# Patient Record
Sex: Female | Born: 1985 | Race: Black or African American | Hispanic: No | Marital: Single | Smoking: Never smoker
Health system: Southern US, Community
[De-identification: ages and names within clinical notes are randomized; demographics above are authoritative.]

## PROBLEM LIST (undated history)

## (undated) DIAGNOSIS — G709 Myoneural disorder, unspecified: Secondary | ICD-10-CM

## (undated) DIAGNOSIS — R519 Headache, unspecified: Secondary | ICD-10-CM

## (undated) DIAGNOSIS — Z331 Pregnant state, incidental: Secondary | ICD-10-CM

## (undated) HISTORY — PX: TUBAL LIGATION: SHX77

---
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
## 2003-01-20 ENCOUNTER — Emergency Department (HOSPITAL_COMMUNITY): Admission: EM | Admit: 2003-01-20 | Discharge: 2003-01-20 | Payer: Self-pay | Admitting: Emergency Medicine

## 2003-03-06 ENCOUNTER — Emergency Department (HOSPITAL_COMMUNITY): Admission: EM | Admit: 2003-03-06 | Discharge: 2003-03-06 | Payer: Self-pay | Admitting: Emergency Medicine

## 2003-10-29 ENCOUNTER — Emergency Department (HOSPITAL_COMMUNITY): Admission: EM | Admit: 2003-10-29 | Discharge: 2003-10-29 | Payer: Self-pay | Admitting: Emergency Medicine

## 2003-10-30 ENCOUNTER — Emergency Department (HOSPITAL_COMMUNITY): Admission: EM | Admit: 2003-10-30 | Discharge: 2003-10-30 | Payer: Self-pay | Admitting: Emergency Medicine

## 2004-08-28 ENCOUNTER — Emergency Department (HOSPITAL_COMMUNITY): Admission: EM | Admit: 2004-08-28 | Discharge: 2004-08-28 | Payer: Self-pay | Admitting: Emergency Medicine

## 2004-09-19 ENCOUNTER — Emergency Department (HOSPITAL_COMMUNITY): Admission: EM | Admit: 2004-09-19 | Discharge: 2004-09-19 | Payer: Self-pay | Admitting: Emergency Medicine

## 2004-10-01 ENCOUNTER — Emergency Department (HOSPITAL_COMMUNITY): Admission: EM | Admit: 2004-10-01 | Discharge: 2004-10-01 | Payer: Self-pay | Admitting: Emergency Medicine

## 2005-01-22 ENCOUNTER — Ambulatory Visit (HOSPITAL_COMMUNITY): Admission: RE | Admit: 2005-01-22 | Discharge: 2005-01-22 | Payer: Self-pay | Admitting: Obstetrics and Gynecology

## 2005-04-03 ENCOUNTER — Inpatient Hospital Stay (HOSPITAL_COMMUNITY): Admission: AD | Admit: 2005-04-03 | Discharge: 2005-04-06 | Payer: Self-pay | Admitting: Obstetrics and Gynecology

## 2006-07-28 ENCOUNTER — Emergency Department (HOSPITAL_COMMUNITY): Admission: EM | Admit: 2006-07-28 | Discharge: 2006-07-28 | Payer: Self-pay | Admitting: Emergency Medicine

## 2008-01-17 ENCOUNTER — Encounter (INDEPENDENT_AMBULATORY_CARE_PROVIDER_SITE_OTHER): Payer: Self-pay | Admitting: Unknown Physician Specialty

## 2008-01-17 ENCOUNTER — Other Ambulatory Visit: Admission: RE | Admit: 2008-01-17 | Discharge: 2008-01-17 | Payer: Self-pay | Admitting: Unknown Physician Specialty

## 2008-10-24 ENCOUNTER — Emergency Department (HOSPITAL_COMMUNITY): Admission: EM | Admit: 2008-10-24 | Discharge: 2008-10-24 | Payer: Self-pay | Admitting: Emergency Medicine

## 2008-10-30 ENCOUNTER — Other Ambulatory Visit: Admission: RE | Admit: 2008-10-30 | Discharge: 2008-10-30 | Payer: Self-pay | Admitting: Unknown Physician Specialty

## 2008-10-30 ENCOUNTER — Encounter (INDEPENDENT_AMBULATORY_CARE_PROVIDER_SITE_OTHER): Payer: Self-pay | Admitting: Unknown Physician Specialty

## 2009-08-01 ENCOUNTER — Emergency Department (HOSPITAL_COMMUNITY): Admission: EM | Admit: 2009-08-01 | Discharge: 2009-08-02 | Payer: Self-pay | Admitting: Emergency Medicine

## 2009-11-20 ENCOUNTER — Emergency Department (HOSPITAL_COMMUNITY): Admission: EM | Admit: 2009-11-20 | Discharge: 2009-11-20 | Payer: Self-pay | Admitting: Emergency Medicine

## 2010-01-14 ENCOUNTER — Emergency Department (HOSPITAL_COMMUNITY)
Admission: EM | Admit: 2010-01-14 | Discharge: 2010-01-14 | Payer: Self-pay | Source: Home / Self Care | Admitting: Emergency Medicine

## 2010-04-21 LAB — DIFFERENTIAL
Basophils Absolute: 0 10*3/uL (ref 0.0–0.1)
Basophils Relative: 1 % (ref 0–1)
Eosinophils Absolute: 0.1 10*3/uL (ref 0.0–0.7)
Eosinophils Relative: 2 % (ref 0–5)
Lymphocytes Relative: 51 % — ABNORMAL HIGH (ref 12–46)
Lymphs Abs: 1.5 10*3/uL (ref 0.7–4.0)
Monocytes Absolute: 0.3 10*3/uL (ref 0.1–1.0)
Monocytes Relative: 9 % (ref 3–12)
Neutro Abs: 1.1 10*3/uL — ABNORMAL LOW (ref 1.7–7.7)
Neutrophils Relative %: 37 % — ABNORMAL LOW (ref 43–77)

## 2010-04-21 LAB — BASIC METABOLIC PANEL
BUN: 10 mg/dL (ref 6–23)
CO2: 28 mEq/L (ref 19–32)
Calcium: 10 mg/dL (ref 8.4–10.5)
Chloride: 103 mEq/L (ref 96–112)
Creatinine, Ser: 0.68 mg/dL (ref 0.4–1.2)
GFR calc Af Amer: >60 mL/min (ref >60)
GFR calc non Af Amer: >60 mL/min (ref >60)
Glucose, Bld: 85 mg/dL (ref 70–99)
Potassium: 4.2 mEq/L (ref 3.5–5.1)
Sodium: 138 mEq/L (ref 135–145)

## 2010-04-21 LAB — CBC
HCT: 38.2 % (ref 36.0–46.0)
Hemoglobin: 12.6 g/dL (ref 12.0–15.0)
MCH: 27.5 pg (ref 26.0–34.0)
MCHC: 33 g/dL (ref 30.0–36.0)
MCV: 83.4 fL (ref 78.0–100.0)
Platelets: 301 10*3/uL (ref 150–400)
RBC: 4.58 MIL/uL (ref 3.87–5.11)
RDW: 13.8 % (ref 11.5–15.5)
WBC: 3.1 10*3/uL — ABNORMAL LOW (ref 4.0–10.5)

## 2010-04-21 LAB — URINALYSIS, ROUTINE W REFLEX MICROSCOPIC
Bilirubin Urine: NEGATIVE
Glucose, UA: NEGATIVE mg/dL
Hgb urine dipstick: NEGATIVE
Ketones, ur: NEGATIVE mg/dL
Leukocytes, UA: NEGATIVE
Nitrite: POSITIVE — AB
Protein, ur: NEGATIVE mg/dL
Specific Gravity, Urine: 1.015 (ref 1.005–1.030)
Urobilinogen, UA: 0.2 mg/dL (ref 0.0–1.0)
pH: 7.5 (ref 5.0–8.0)

## 2010-04-21 LAB — URINE MICROSCOPIC-ADD ON

## 2010-04-21 LAB — URINE CULTURE
Colony Count: >=100000
Culture  Setup Time: 201112061941

## 2010-04-21 LAB — PREGNANCY, URINE: Preg Test, Ur: NEGATIVE

## 2010-04-23 LAB — BASIC METABOLIC PANEL
BUN: 9 mg/dL (ref 6–23)
CO2: 26 mEq/L (ref 19–32)
Calcium: 9.5 mg/dL (ref 8.4–10.5)
Chloride: 106 mEq/L (ref 96–112)
Creatinine, Ser: 0.68 mg/dL (ref 0.4–1.2)
GFR calc Af Amer: >60 mL/min (ref >60)
GFR calc non Af Amer: >60 mL/min (ref >60)
Glucose, Bld: 84 mg/dL (ref 70–99)
Potassium: 3.8 mEq/L (ref 3.5–5.1)
Sodium: 139 mEq/L (ref 135–145)

## 2010-04-23 LAB — CBC
HCT: 36.8 % (ref 36.0–46.0)
Hemoglobin: 11.9 g/dL — ABNORMAL LOW (ref 12.0–15.0)
MCH: 28.2 pg (ref 26.0–34.0)
MCHC: 32.4 g/dL (ref 30.0–36.0)
MCV: 87 fL (ref 78.0–100.0)
Platelets: 246 10*3/uL (ref 150–400)
RBC: 4.23 MIL/uL (ref 3.87–5.11)
RDW: 14.2 % (ref 11.5–15.5)
WBC: 4.9 10*3/uL (ref 4.0–10.5)

## 2010-04-23 LAB — DIFFERENTIAL
Basophils Absolute: 0 10*3/uL (ref 0.0–0.1)
Basophils Relative: 1 % (ref 0–1)
Eosinophils Absolute: 0.1 10*3/uL (ref 0.0–0.7)
Eosinophils Relative: 2 % (ref 0–5)
Lymphocytes Relative: 36 % (ref 12–46)
Lymphs Abs: 1.8 10*3/uL (ref 0.7–4.0)
Monocytes Absolute: 0.5 10*3/uL (ref 0.1–1.0)
Monocytes Relative: 11 % (ref 3–12)
Neutro Abs: 2.5 10*3/uL (ref 1.7–7.7)
Neutrophils Relative %: 51 % (ref 43–77)

## 2010-04-23 LAB — D-DIMER, QUANTITATIVE: D-Dimer, Quant: 0.29 ug/mL-FEU (ref 0.00–0.48)

## 2010-04-23 LAB — POCT CARDIAC MARKERS
CKMB, poc: <1 ng/mL — ABNORMAL LOW (ref 1.0–8.0)
Myoglobin, poc: 35.9 ng/mL (ref 12–200)
Troponin i, poc: <0.05 ng/mL (ref 0.00–0.09)

## 2010-06-27 NOTE — Op Note (Signed)
NAME:  TAWAN, CORKERN NO.:  [id number]   MEDICAL RECORD NO.:  [id number]          PATIENT TYPE:  OIB   LOCATION:  LDR4                          FACILITY:  [facility]   PHYSICIAN:  Tilda Burrow, M.D. DATE OF BIRTH:  12/30/1985   DATE OF PROCEDURE:  DATE OF DISCHARGE:  01/22/2005                                 OPERATIVE REPORT   Jennylee is [redacted] weeks pregnant and came to labor and delivery with complaints  of lower pelvic pressure. She had been seen in the office the day before,  noted to have a cervical length of 2.8 cm on vaginal probe ultrasound which  was decreased from her baseline cervical length of 3.5 cm. Her cervix is a  loose fingertip, 60% effaced, -1 station with the vertex applied to the  cervix per Zerita Boers. She received a prescription for Brethine several  days prior to her office visit to which she had not picked up. She did pick  up her prescription after her office visit on Wednesday and began taking it.  A fetal fibronectin was performed in the office which came back negative.  She presented to labor and delivery on January 22, 2005 with complaints of  lower abdominal pain, a contraction every 45 minutes. She was noted to be  having a mild contraction very rarely, maybe once an hour. Cervical exam was  unchanged. Fetal heart rate actually looked very good. She was kept on  observation for six hours on the toco, and no more uterine activity was  noted. She was discharged home with instructions to continue on her Brethine  and to follow up as soon as possible for any change in contraction pattern.      Jacklyn Shell, C.N.M.      Tilda Burrow, M.D.  Electronically Signed    FC/MEDQ  D:  01/23/2005  T:  01/23/2005  Job:  [id number]

## 2010-06-27 NOTE — Op Note (Signed)
NAME:  LATAJAH, THUMAN NO.:  [id number]   MEDICAL RECORD NO.:  [id number]          PATIENT TYPE:  INP   LOCATION:  LDR1                          FACILITY:  [facility]   PHYSICIAN:  Lazaro Arms, M.D.   DATE OF BIRTH:  10/03/1985   DATE OF PROCEDURE:  04/04/2005  DATE OF DISCHARGE:                                 OPERATIVE REPORT   EPIDURAL NOTE  Patient is a 25 year old gravida 1, para 0, currently be augmented for  premature rupture of the membranes and progressed to 6 cm and then went no  further  She is requesting an epidural to be placed.   DESCRIPTION OF PROCEDURE:  She is placed in the sitting position.  Betadine  prep is used.  A field drape is placed.  Lidocaine 1% is injected in the L3-  4 interspace  A 17-gauge Touhy needle is used; and a loss of resistance  technique employed.  The epidural space is found with 1 pass without  difficulty.  Then 10 mL of 0.125% bupivacaine plain was given as a test dose  without ill effects.  The epidural catheter is then fed, 5 cm into the  epidural space.  An additional 10 mL of 0.125% bupivacaine is given and  taped down.  The continuous infusion of 0.125% bupivacaine with 2 mcg/mL of  fentanyl is begun at 12 mL/hour  The patient is getting good pain relief.  The blood pressure is stable.  Fetal heart rate tracing is stable.      Lazaro Arms, M.D.  Electronically Signed     LHE/MEDQ  D:  04/04/2005  T:  04/04/2005  Job:  [id number]

## 2010-06-27 NOTE — Op Note (Signed)
NAME:  Joann Elliott, HOCH NO.:  [id number]   MEDICAL RECORD NO.:  [id number]          PATIENT TYPE:  INP   LOCATION:  LDR1                          FACILITY:  [facility]   PHYSICIAN:  Lazaro Arms, M.D.   DATE OF BIRTH:  [birth year]   DATE OF PROCEDURE:  04/04/2005  DATE OF DISCHARGE:                                 OPERATIVE REPORT   DELIVERY NOTE:  This patient is an 25 year old, gravida 1 para 0 who came in  with premature rupture of the membranes, labored to 6 cm and then required  augmentation with pitocin.  She progressed well through the stage of labor,  began pushing, and delivered over an intact perineum a viable female at 23  with Apgars of 9 and 9 with the weight pending.   The infant underwent routine neonatal resuscitation.  The perineum was  intact.  She had 2, very small, periurethral lacerations which were not  bleeding and did not require repair. Cord blood and cord gas were sent.  The  placenta was not sent.  It appeared to be normal and intact.  The uterus was  firm, blood loss for delivery was 250 mL.  Epidural catheter was removed  intact.      Lazaro Arms, M.D.  Electronically Signed     LHE/MEDQ  D:  04/04/2005  T:  04/04/2005  Job:  [id number]

## 2010-06-27 NOTE — H&P (Signed)
NAME:  Joann Elliott NO.:  [id number]   MEDICAL RECORD NO.:  [id number]          PATIENT TYPE:  INP   LOCATION:  LDR1                          FACILITY:  [facility]   PHYSICIAN:  Lazaro Arms, M.D.   DATE OF BIRTH:  19-Aug-1985   DATE OF ADMISSION:  04/03/2005  DATE OF DISCHARGE:  LH                                HISTORY & PHYSICAL   CHIEF COMPLAINT:  Preterm premature rupture of membranes.   HISTORY OF PRESENT ILLNESS:  Joann Elliott is a 25 year old gravida 1, para 0 with  EDC of May 03, 2005 based on first and second trimester ultrasounds  placing her at 35 weeks, 5 days gestation.  She began prenatal care in her  first trimester and has regular visits since then. Her pregnancy course has  been complicated by a short cervix noticed in about her 25th week of  gestation. Her last cervical length was on February 24, 2005 at [redacted] weeks  gestation and was 1 cm then.  She has been taking Brethine PRN contractions  but had remained fingertip, 75% effaced, minus station, vertex presentation  through the last month or so of her pregnancy.  Prenatal  labs include blood  type O positive, Rubella immune, HBsAg, HIV, RPR, sickle cell trait are all  negative.  She did have a positive Chlamydia which was treated with a  negative repeat.  One hour GTT was normal at 130. She was HSV 2 negative.  Blood pressure's have been 100's-130's/60's-80's.   PAST MEDICAL HISTORY:  Noncontributory.   PAST SURGICAL HISTORY:  Noncontributory.   ALLERGIES:  No known drug allergies.   MEDICATIONS:  Prenatal vitamins.   SOCIAL HISTORY:  She is single.  Father of the baby is supportive.  She had  been working at [employer] until we took her out of work in December.  She  denies cigarette smoking, alcohol or drug use.   FAMILY HISTORY:  Positive for diabetes.   PHYSICAL EXAMINATION:  HEENT:  Within normal limits.  HEART:  Regular rate and rhythm.  LUNGS:  Clear.  ABDOMEN:  Soft, nontender.   Fundal height is 33 cm.  PELVIC:  Examination after complaints of leaking moderate amounts of fluid,  all day since this morning, changing her underwear on multiple occasions  with a clear runny fluid, sterile speculum examination was performed.  She  did have positive pooling, fluid in the posterior fornix, which was  Nitrazine positive.  A Fern slide determined as positive due to the presence  of three clear Fern's.  Cervical examination is 3 to 4 cm, 85% effaced,  minus 1 station, vertex presentation.  I did feel like I could feel  membranes covering the fetal vertex.   IMPRESSION:  Intrauterine pregnancy at 35 weeks, 5 days gestation with  preterm premature rupture of membranes.  The patient has been instructed to  go to labor and delivery for admission.  Dr. Despina Hidden has been informed of her  status and he is assuming care from this point on.      Joann Elliott, C.N.M.  Lazaro Arms, M.D.  Electronically Signed    FC/MEDQ  D:  04/03/2005  T:  04/03/2005  Job:  [id number]   cc:   Francoise Schaumann. Milford Cage DO, FAAP  Fax: [phone]

## 2010-08-08 ENCOUNTER — Emergency Department (HOSPITAL_COMMUNITY)
Admission: EM | Admit: 2010-08-08 | Discharge: 2010-08-08 | Disposition: A | Discharge status: Home / Self Care | Payer: Self-pay | Attending: Emergency Medicine | Admitting: Emergency Medicine

## 2010-08-08 ENCOUNTER — Emergency Department (HOSPITAL_COMMUNITY): Payer: Self-pay

## 2010-08-08 DIAGNOSIS — R630 Anorexia: Secondary | ICD-10-CM | POA: Insufficient documentation

## 2010-08-08 DIAGNOSIS — R109 Unspecified abdominal pain: Secondary | ICD-10-CM | POA: Insufficient documentation

## 2010-08-08 DIAGNOSIS — R300 Dysuria: Secondary | ICD-10-CM | POA: Insufficient documentation

## 2010-08-08 DIAGNOSIS — N83209 Unspecified ovarian cyst, unspecified side: Secondary | ICD-10-CM | POA: Insufficient documentation

## 2010-08-08 LAB — PREGNANCY, URINE: Preg Test, Ur: NEGATIVE

## 2010-08-08 LAB — URINALYSIS, ROUTINE W REFLEX MICROSCOPIC
Bilirubin Urine: NEGATIVE
Glucose, UA: NEGATIVE mg/dL
Hgb urine dipstick: NEGATIVE
Ketones, ur: NEGATIVE mg/dL
Nitrite: NEGATIVE
Protein, ur: NEGATIVE mg/dL
Specific Gravity, Urine: 1.02 (ref 1.005–1.030)
Urobilinogen, UA: 1 mg/dL (ref 0.0–1.0)
pH: 6.5 (ref 5.0–8.0)

## 2010-08-08 LAB — CBC
Platelets: 240 10*3/uL (ref 150–400)
RBC: 4.56 MIL/uL (ref 3.87–5.11)
WBC: 3.3 10*3/uL — ABNORMAL LOW (ref 4.0–10.5)

## 2010-08-08 LAB — DIFFERENTIAL
Basophils Absolute: 0 10*3/uL (ref 0.0–0.1)
Basophils Relative: 1 % (ref 0–1)
Eosinophils Absolute: 0 10*3/uL (ref 0.0–0.7)
Neutrophils Relative %: 42 % — ABNORMAL LOW (ref 43–77)

## 2010-08-08 LAB — HCG, SERUM, QUALITATIVE: Preg, Serum: NEGATIVE

## 2010-08-08 LAB — WET PREP, GENITAL: Yeast Wet Prep HPF POC: NONE SEEN

## 2010-08-08 LAB — URINE MICROSCOPIC-ADD ON

## 2010-08-08 LAB — BASIC METABOLIC PANEL
CO2: 26 mEq/L (ref 19–32)
Chloride: 104 mEq/L (ref 96–112)
GFR calc Af Amer: >60 mL/min (ref >60)
Sodium: 138 mEq/L (ref 135–145)

## 2010-11-28 ENCOUNTER — Encounter: Payer: Self-pay | Admitting: Emergency Medicine

## 2010-11-28 ENCOUNTER — Emergency Department (HOSPITAL_COMMUNITY)
Admission: EM | Admit: 2010-11-28 | Discharge: 2010-11-28 | Disposition: A | Discharge status: Home / Self Care | Payer: Medicaid Other | Attending: Emergency Medicine | Admitting: Emergency Medicine

## 2010-11-28 DIAGNOSIS — O99891 Other specified diseases and conditions complicating pregnancy: Secondary | ICD-10-CM | POA: Insufficient documentation

## 2010-11-28 DIAGNOSIS — B9689 Other specified bacterial agents as the cause of diseases classified elsewhere: Secondary | ICD-10-CM | POA: Insufficient documentation

## 2010-11-28 DIAGNOSIS — O26899 Other specified pregnancy related conditions, unspecified trimester: Secondary | ICD-10-CM

## 2010-11-28 DIAGNOSIS — A499 Bacterial infection, unspecified: Secondary | ICD-10-CM | POA: Insufficient documentation

## 2010-11-28 DIAGNOSIS — R1032 Left lower quadrant pain: Secondary | ICD-10-CM | POA: Insufficient documentation

## 2010-11-28 DIAGNOSIS — O210 Mild hyperemesis gravidarum: Secondary | ICD-10-CM | POA: Insufficient documentation

## 2010-11-28 DIAGNOSIS — O2390 Unspecified genitourinary tract infection in pregnancy, unspecified trimester: Secondary | ICD-10-CM | POA: Insufficient documentation

## 2010-11-28 DIAGNOSIS — N760 Acute vaginitis: Secondary | ICD-10-CM

## 2010-11-28 LAB — CBC
HCT: 34.7 % — ABNORMAL LOW (ref 36.0–46.0)
MCH: 28.3 pg (ref 26.0–34.0)
MCV: 85.5 fL (ref 78.0–100.0)
Platelets: 205 10*3/uL (ref 150–400)
RBC: 4.06 MIL/uL (ref 3.87–5.11)
RDW: 13.5 % (ref 11.5–15.5)

## 2010-11-28 LAB — BASIC METABOLIC PANEL
CO2: 24 mEq/L (ref 19–32)
Calcium: 9.5 mg/dL (ref 8.4–10.5)
Creatinine, Ser: 0.56 mg/dL (ref 0.50–1.10)
GFR calc non Af Amer: >90 mL/min (ref >90)
Glucose, Bld: 78 mg/dL (ref 70–99)
Sodium: 134 mEq/L — ABNORMAL LOW (ref 135–145)

## 2010-11-28 LAB — DIFFERENTIAL
Eosinophils Absolute: 0.1 10*3/uL (ref 0.0–0.7)
Eosinophils Relative: 2 % (ref 0–5)
Lymphs Abs: 1.2 10*3/uL (ref 0.7–4.0)
Monocytes Absolute: 0.5 10*3/uL (ref 0.1–1.0)

## 2010-11-28 LAB — URINALYSIS, ROUTINE W REFLEX MICROSCOPIC
Bilirubin Urine: NEGATIVE
Glucose, UA: NEGATIVE mg/dL
Hgb urine dipstick: NEGATIVE
Ketones, ur: NEGATIVE mg/dL
Protein, ur: NEGATIVE mg/dL
Urobilinogen, UA: 1 mg/dL (ref 0.0–1.0)

## 2010-11-28 LAB — WET PREP, GENITAL

## 2010-11-28 LAB — HCG, QUANTITATIVE, PREGNANCY: hCG, Beta Chain, Quant, S: 15128 m[IU]/mL — ABNORMAL HIGH (ref <5)

## 2010-11-28 MED ORDER — METRONIDAZOLE 500 MG PO TABS: 500.0000 mg | ORAL_TABLET | Freq: Two times a day (BID) | ORAL | Status: AC

## 2010-11-28 MED ORDER — ONDANSETRON 8 MG PO TBDP
8.0000 mg | ORAL_TABLET | Freq: Once | ORAL | Status: DC
  Filled 2010-11-28: qty 1

## 2010-11-28 MED ORDER — ONDANSETRON HCL 4 MG PO TABS: 4.0000 mg | ORAL_TABLET | Freq: Four times a day (QID) | ORAL | Status: AC

## 2010-11-28 NOTE — ED Notes (Signed)
Pt states she is [redacted] weeks pregnant. Pt c/o llq abd pain since 1300 today. With n/v/d x 3 days. Pt to have first ultrasound on Tuesday. nad noted.

## 2010-11-28 NOTE — ED Notes (Signed)
Pt c/o llq abd pain. Pt states she has had vomiting and diarrhea since 1230 pm today. Pt states she had similar occurrence this past Wednesday.

## 2010-11-28 NOTE — ED Provider Notes (Signed)
History     CSN: [id number] Arrival date & time: 11/28/2010  4:43 PM   First MD Initiated Contact with Patient 11/28/10 1702      Chief Complaint  Patient presents with  . Abdominal Pain    (Consider location/radiation/quality/duration/timing/severity/associated sxs/prior treatment) HPI  History reviewed. No pertinent past medical history.  History reviewed. No pertinent past surgical history.  History reviewed. No pertinent family history.  History  Substance Use Topics  . Smoking status: Never Smoker   . Smokeless tobacco: Not on file  . Alcohol Use: No    OB History    Grav Para Term Preterm Abortions TAB SAB Ect Mult Living   3 2        1       Review of Systems  Allergies  Review of patient's allergies indicates no known allergies.  Home Medications   Current Outpatient Rx  Name Route Sig Dispense Refill  . PRENATAL 27-0.8 MG PO TABS Oral Take 1 tablet by mouth daily.      Marland Kitchen METRONIDAZOLE 500 MG PO TABS Oral Take 1 tablet (500 mg total) by mouth 2 (two) times daily. For 7 days 14 tablet 0  . ONDANSETRON HCL 4 MG PO TABS Oral Take 1 tablet (4 mg total) by mouth every 6 (six) hours. As needed for vomiting 12 tablet 0    BP 129/79  Pulse 87  Temp(Src) 98.6 F (37 C) (Oral)  Resp 20  Ht 5\' 2"  (1.575 m)  Wt 141 lb (63.957 kg)  BMI 25.79 kg/m2  SpO2 99%  LMP 10/18/2010  Physical Exam  ED Course  Procedures (including critical care time)  Labs Reviewed  CBC - Abnormal; Notable for the following:    WBC 3.8 (*)    Hemoglobin 11.5 (*)    HCT 34.7 (*)    All other components within normal limits  DIFFERENTIAL - Abnormal; Notable for the following:    Monocytes Relative 13 (*)    All other components within normal limits  BASIC METABOLIC PANEL - Abnormal; Notable for the following:    Sodium 134 (*)    All other components within normal limits  URINALYSIS, ROUTINE W REFLEX MICROSCOPIC - Abnormal; Notable for the following:    Specific Gravity,  Urine >1.030 (*)    All other components within normal limits  HCG, QUANTITATIVE, PREGNANCY - Abnormal; Notable for the following:    hCG, Beta Chain, Quant, S 15128 (*)    All other components within normal limits  WET PREP, GENITAL - Abnormal; Notable for the following:    Clue Cells, Wet Prep FEW (*)    WBC, Wet Prep HPF POC MODERATE (*)    All other components within normal limits  GC/CHLAMYDIA PROBE AMP, GENITAL   No results found.   1. Abdominal pain in pregnancy   2. Bacterial vaginosis       MDM  See PA note and attending note below.  Donnetta Hutching MD      Medical screening examination/treatment/procedure(s) were conducted as a shared visit with non-physician practitioner(s) and myself.  I personally evaluated the patient during the encounter.... no vaginal bleeding. shee is alert and ambulatory. No acute abdomen. Has obstetrical followup on Tuesday  Donnetta Hutching, MD 12/19/10 [phone]

## 2010-11-28 NOTE — ED Provider Notes (Signed)
History     CSN: [id number] Arrival date & time: 11/28/2010  4:43 PM   First MD Initiated Contact with Patient 11/28/10 1702      Chief Complaint  Patient presents with  . Abdominal Pain    (Consider location/radiation/quality/duration/timing/severity/associated sxs/prior treatment) HPI Comments: Patient who is approx [redacted] weeks pregnant by her LMP.  Patient is P2 G1 Ab0 C/o nausea and vomiting for 3 days and developed aching type pain to her LLQ earlier on the day of ED arrival.  Pregnancy was confirmed by the health dept and she has appt with OB/GYN on Tuesday.  She denies fever, dysuria, vaginal pain or bleeding  Patient is a 25 y.o. female presenting with abdominal pain. The history is provided by the patient.  Abdominal Pain The primary symptoms of the illness include abdominal pain, nausea and vomiting. The primary symptoms of the illness do not include fever, fatigue, shortness of breath, diarrhea, dysuria, vaginal discharge or vaginal bleeding. The current episode started 3 to 5 hours ago. The onset of the illness was gradual. The problem has not changed since onset. The abdominal pain began 3 to 5 hours ago. The pain came on gradually. The abdominal pain has been unchanged since its onset. The abdominal pain is located in the LLQ. The abdominal pain does not radiate. The abdominal pain is relieved by nothing. Exacerbated by: nothing.  Nausea began 3 to 5 days ago. The nausea is associated with eating.  The vomiting began more than 2 days ago. Vomiting occurs 2 to 5 times per day. The emesis contains stomach contents.  The patient states that she believes she is currently pregnant. The patient has not had a change in bowel habit. Symptoms associated with the illness do not include chills, constipation, urgency, hematuria or back pain. Significant associated medical issues do not include diabetes, sickle cell disease or substance abuse.    History reviewed. No pertinent past medical  history.  History reviewed. No pertinent past surgical history.  History reviewed. No pertinent family history.  History  Substance Use Topics  . Smoking status: Never Smoker   . Smokeless tobacco: Not on file  . Alcohol Use: No    OB History    Grav Para Term Preterm Abortions TAB SAB Ect Mult Living   3 2        1       Review of Systems  Constitutional: Negative for fever, chills, activity change, appetite change and fatigue.  HENT: Negative for sore throat, trouble swallowing, neck pain and neck stiffness.   Respiratory: Negative for cough, shortness of breath and wheezing.   Cardiovascular: Negative for chest pain and palpitations.  Gastrointestinal: Positive for nausea, vomiting and abdominal pain. Negative for diarrhea, constipation, blood in stool and abdominal distention.  Genitourinary: Negative for dysuria, urgency, hematuria, flank pain, decreased urine volume, vaginal bleeding, vaginal discharge, difficulty urinating, vaginal pain, menstrual problem and pelvic pain.  Musculoskeletal: Negative for myalgias, back pain and arthralgias.  Skin: Negative for rash.  Neurological: Negative for dizziness, weakness and numbness.  Hematological: Does not bruise/bleed easily.  All other systems reviewed and are negative.    Allergies  Review of patient's allergies indicates no known allergies.  Home Medications  No current outpatient prescriptions on file.  BP 129/79  Pulse 87  Temp(Src) 98.6 F (37 C) (Oral)  Resp 20  Ht 5\' 2"  (1.575 m)  Wt 141 lb (63.957 kg)  BMI 25.79 kg/m2  SpO2 99%  LMP 10/18/2010  Physical Exam  Nursing note and vitals reviewed. Constitutional: She is oriented to person, place, and time. She appears well-developed and well-nourished.  Non-toxic appearance. She does not have a sickly appearance. She does not appear ill. No distress.  HENT:  Head: Normocephalic and atraumatic.  Mouth/Throat: Oropharynx is clear and moist.  Neck: Normal  range of motion. Neck supple.  Cardiovascular: Normal rate, regular rhythm and normal heart sounds.   Pulmonary/Chest: Effort normal and breath sounds normal. No respiratory distress. She exhibits no tenderness.  Abdominal: Soft. She exhibits no distension and no mass. There is tenderness. There is no guarding.  Genitourinary: Uterus is enlarged. Uterus is not tender. Cervix exhibits no motion tenderness, no discharge and no friability. Right adnexum displays no mass, no tenderness and no fullness. Left adnexum displays no mass, no tenderness and no fullness. No tenderness or bleeding around the vagina. No foreign body around the vagina. No vaginal discharge found.       No bleeding from the cervix, os is closed.    Musculoskeletal: Normal range of motion. She exhibits no edema and no tenderness.  Lymphadenopathy:    She has no cervical adenopathy.  Neurological: She is alert and oriented to person, place, and time. No cranial nerve deficit. She exhibits normal muscle tone. Coordination normal.  Skin: Skin is warm and dry.    ED Course  Procedures (including critical care time)  Results for orders placed during the hospital encounter of 11/28/10  CBC      Component Value Range   WBC 3.8 (*) 4.0 - 10.5 (K/uL)   RBC 4.06  3.87 - 5.11 (MIL/uL)   Hemoglobin 11.5 (*) 12.0 - 15.0 (g/dL)   HCT 11.9 (*) 14.7 - 46.0 (%)   MCV 85.5  78.0 - 100.0 (fL)   MCH 28.3  26.0 - 34.0 (pg)   MCHC 33.1  30.0 - 36.0 (g/dL)   RDW 82.9  56.2 - 13.0 (%)   Platelets 205  150 - 400 (K/uL)  DIFFERENTIAL      Component Value Range   Neutrophils Relative 53  43 - 77 (%)   Neutro Abs 2.0  1.7 - 7.7 (K/uL)   Lymphocytes Relative 32  12 - 46 (%)   Lymphs Abs 1.2  0.7 - 4.0 (K/uL)   Monocytes Relative 13 (*) 3 - 12 (%)   Monocytes Absolute 0.5  0.1 - 1.0 (K/uL)   Eosinophils Relative 2  0 - 5 (%)   Eosinophils Absolute 0.1  0.0 - 0.7 (K/uL)   Basophils Relative 0  0 - 1 (%)   Basophils Absolute 0.0  0.0 - 0.1  (K/uL)  BASIC METABOLIC PANEL      Component Value Range   Sodium 134 (*) 135 - 145 (mEq/L)   Potassium 3.6  3.5 - 5.1 (mEq/L)   Chloride 100  96 - 112 (mEq/L)   CO2 24  19 - 32 (mEq/L)   Glucose, Bld 78  70 - 99 (mg/dL)   BUN 9  6 - 23 (mg/dL)   Creatinine, Ser 8.65  0.50 - 1.10 (mg/dL)   Calcium 9.5  8.4 - 78.4 (mg/dL)   GFR calc non Af Amer >90  >90 (mL/min)   GFR calc Af Amer >90  >90 (mL/min)  URINALYSIS, ROUTINE W REFLEX MICROSCOPIC      Component Value Range   Color, Urine YELLOW  YELLOW    Appearance CLEAR  CLEAR    Specific Gravity, Urine >1.030 (*) 1.005 - 1.030  pH 5.5  5.0 - 8.0    Glucose, UA NEGATIVE  NEGATIVE (mg/dL)   Hgb urine dipstick NEGATIVE  NEGATIVE    Bilirubin Urine NEGATIVE  NEGATIVE    Ketones, ur NEGATIVE  NEGATIVE (mg/dL)   Protein, ur NEGATIVE  NEGATIVE (mg/dL)   Urobilinogen, UA 1.0  0.0 - 1.0 (mg/dL)   Nitrite NEGATIVE  NEGATIVE    Leukocytes, UA NEGATIVE  NEGATIVE   HCG, QUANTITATIVE, PREGNANCY      Component Value Range   hCG, Beta Chain, Quant, S 15128 (*) <5 (mIU/mL)  WET PREP, GENITAL      Component Value Range   Yeast, Wet Prep NONE SEEN  NONE SEEN    Trich, Wet Prep NONE SEEN  NONE SEEN    Clue Cells, Wet Prep FEW (*) NONE SEEN    WBC, Wet Prep HPF POC MODERATE (*) NONE SEEN          MDM     6:57 PM patient is resting, watching TV and talking on the phone.  Non-toxic appearing.  Abd is mildly ttp of the LLQ.  No guarding or rebound tenderness.  Tolerating oral fluids w/o difficulty.  No active vomiting during ED stay.  Patient has also been seen by the EDP prior to d/c.    I have discussed the care plan with the EDP and the patient.  She agrees to f/u with her OB on Tuesday for recheck.  I have also advised her to return here for any worsening symptoms or if she develops vaginal bleeding.  She verbalized understanding and agrees to the care plan.     Teruo Stilley L. [place], [place] 12/03/10 1354

## 2010-11-29 ENCOUNTER — Emergency Department (HOSPITAL_COMMUNITY)
Admission: EM | Admit: 2010-11-29 | Discharge: 2010-11-30 | Disposition: A | Discharge status: Home / Self Care | Payer: Medicaid Other | Attending: Emergency Medicine | Admitting: Emergency Medicine

## 2010-11-29 ENCOUNTER — Encounter (HOSPITAL_COMMUNITY): Payer: Self-pay

## 2010-11-29 DIAGNOSIS — N739 Female pelvic inflammatory disease, unspecified: Secondary | ICD-10-CM | POA: Insufficient documentation

## 2010-11-29 DIAGNOSIS — O26859 Spotting complicating pregnancy, unspecified trimester: Secondary | ICD-10-CM | POA: Insufficient documentation

## 2010-11-29 DIAGNOSIS — O26899 Other specified pregnancy related conditions, unspecified trimester: Secondary | ICD-10-CM

## 2010-11-29 DIAGNOSIS — O99891 Other specified diseases and conditions complicating pregnancy: Secondary | ICD-10-CM | POA: Insufficient documentation

## 2010-11-29 DIAGNOSIS — O98319 Other infections with a predominantly sexual mode of transmission complicating pregnancy, unspecified trimester: Secondary | ICD-10-CM | POA: Insufficient documentation

## 2010-11-29 DIAGNOSIS — O210 Mild hyperemesis gravidarum: Secondary | ICD-10-CM | POA: Insufficient documentation

## 2010-11-29 DIAGNOSIS — A5619 Other chlamydial genitourinary infection: Secondary | ICD-10-CM | POA: Insufficient documentation

## 2010-11-29 DIAGNOSIS — R1032 Left lower quadrant pain: Secondary | ICD-10-CM | POA: Insufficient documentation

## 2010-11-29 HISTORY — DX: Pregnant state, incidental: Z33.1

## 2010-11-29 NOTE — ED Notes (Signed)
Pt is 5 weeks preg, began spotting and having left lower quad ab pain yesterday, was seen in er for same, tonight more painful

## 2010-11-30 MED ORDER — ACETAMINOPHEN 500 MG PO TABS: 500.0000 mg | ORAL_TABLET | Freq: Once | ORAL | Status: DC

## 2010-11-30 MED ORDER — ACETAMINOPHEN 500 MG PO TABS
ORAL_TABLET | ORAL | Status: AC
  Filled 2010-11-30: qty 1

## 2010-11-30 NOTE — ED Notes (Signed)
Pt left the er stating no needs, she is to follow up with her obgyn.

## 2010-11-30 NOTE — ED Provider Notes (Signed)
History     CSN: [id number] Arrival date & time: 11/29/2010 11:39 PM   First MD Initiated Contact with Patient 11/30/10 0033      Chief Complaint  Patient presents with  . Abdominal Pain  . Vaginal Bleeding    (Consider location/radiation/quality/duration/timing/severity/associated sxs/prior treatment) HPI Comments: Seen 0041. Patient is [redacted] weeks pregnant and was seen in the ER yesterday for LLQ abdominal pain, nausea, vomiting,  and spotting. Her PE was unremarkable. Serum quant in 15,000+ range. She was discharged with prenatal vitamins, zofran, metronidazole. She is back tonight with continued LLQ pain. Nausea has improved on zofran. Senes fever, chills, cough, shortness of breath, urinary symptoms, back pain.    Patient is a 25 y.o. female presenting with abdominal pain and vaginal bleeding. The history is provided by the patient.  Abdominal Pain The primary symptoms of the illness include abdominal pain and vaginal bleeding. The current episode started more than 2 days ago. The onset of the illness was gradual. The problem has not changed since onset. The patient states that she believes she is currently pregnant. The patient has not had a change in bowel habit. Symptoms associated with the illness do not include chills, heartburn, constipation, urgency, hematuria, frequency or back pain.  Vaginal Bleeding Associated symptoms include abdominal pain.    Past Medical History  Diagnosis Date  . Pregnant state, incidental     History reviewed. No pertinent past surgical history.  No family history on file.  History  Substance Use Topics  . Smoking status: Never Smoker   . Smokeless tobacco: Not on file  . Alcohol Use: No    OB History    Grav Para Term Preterm Abortions TAB SAB Ect Mult Living   3 2        1       Review of Systems  Constitutional: Negative for chills.  Gastrointestinal: Positive for abdominal pain. Negative for heartburn and constipation.   Genitourinary: Positive for vaginal bleeding. Negative for urgency, frequency and hematuria.  Musculoskeletal: Negative for back pain.  All other systems reviewed and are negative.    Allergies  Review of patient's allergies indicates no known allergies.  Home Medications   Current Outpatient Rx  Name Route Sig Dispense Refill  . METRONIDAZOLE 500 MG PO TABS Oral Take 1 tablet (500 mg total) by mouth 2 (two) times daily. For 7 days 14 tablet 0  . ONDANSETRON HCL 4 MG PO TABS Oral Take 1 tablet (4 mg total) by mouth every 6 (six) hours. As needed for vomiting 12 tablet 0  . PRENATAL 27-0.8 MG PO TABS Oral Take 1 tablet by mouth daily.        BP 117/70  Pulse 89  Temp(Src) 98.4 F (36.9 C) (Oral)  Resp 20  Ht 5\' 2"  (1.575 m)  Wt 141 lb (63.957 kg)  BMI 25.79 kg/m2  SpO2 100%  LMP 10/18/2010  Physical Exam  Nursing note and vitals reviewed. Constitutional: She is oriented to person, place, and time. She appears well-developed and well-nourished.  HENT:  Head: Atraumatic.  Eyes: EOM are normal.  Neck: Normal range of motion. Neck supple.  Cardiovascular: Normal rate, normal heart sounds and intact distal pulses.   Pulmonary/Chest: Effort normal and breath sounds normal. No respiratory distress. She has no wheezes. She has no rales.  Abdominal: Soft. Bowel sounds are normal. She exhibits no distension and no mass. There is tenderness. There is no rebound and no guarding.  Mild LLQ discomfort with palpation.  Musculoskeletal: Normal range of motion.  Neurological: She is alert and oriented to person, place, and time.  Skin: Skin is warm and dry.    ED Course  Procedures (including critical care time)  Labs Reviewed - No data to display No results found.   No diagnosis found.    MDM  Patient, [redacted] weeks pregnant, with lower abdominal pain , nausea, vomiting, and spotting. Seen and examined yesterday for the same. She is scheduled for 1st OB appointment on Tuesday  with Dr. Emelda Fear. PE yesterday and today without significant findings. Pt stable in ED with no significant deterioration in condition.The patient appears reasonably screened and/or stabilized for discharge and I doubt any other medical condition or other [facility] requiring further screening, evaluation, or treatment in the ED at this time prior to discharge. MDM Reviewed: nursing note, vitals and previous chart Reviewed previous: labs          [facility]. Colon Branch, MD 11/30/10 [phone]

## 2010-12-01 LAB — GC/CHLAMYDIA PROBE AMP, GENITAL
Chlamydia, DNA Probe: POSITIVE — AB
GC Probe Amp, Genital: NEGATIVE

## 2010-12-02 NOTE — ED Notes (Signed)
+   chlamydia Chart sent to EDP office for review. 

## 2010-12-09 NOTE — ED Notes (Signed)
Chart back from MD office.  Chart reviewed by Drucie Opitz PA."Doxycycline 100 mg 1 po BID x 7 days, # 14"  Spoke w/pt.  ID verified x 2.  Pt informed of Dx and need for addl tx.  Pt states her PCP has treated her already w/Azithromycin 500 mg tab x 4, take all at one time.  [facility] completed and faxed.

## 2010-12-09 NOTE — ED Provider Notes (Signed)
Medical screening examination/treatment/procedure(s) were performed by non-physician practitioner and as supervising physician I was immediately available for consultation/collaboration.  Donnetta Hutching, MD 12/09/10 [phone]

## 2011-06-21 ENCOUNTER — Emergency Department (HOSPITAL_COMMUNITY)
Admission: EM | Admit: 2011-06-21 | Discharge: 2011-06-21 | Disposition: A | Discharge status: Other Acute Inpatient Hospital | Payer: Medicaid Other | Attending: Emergency Medicine | Admitting: Emergency Medicine

## 2011-06-21 ENCOUNTER — Encounter (HOSPITAL_COMMUNITY): Payer: Self-pay | Admitting: *Deleted

## 2011-06-21 DIAGNOSIS — O99891 Other specified diseases and conditions complicating pregnancy: Secondary | ICD-10-CM | POA: Insufficient documentation

## 2011-06-21 DIAGNOSIS — R109 Unspecified abdominal pain: Secondary | ICD-10-CM | POA: Insufficient documentation

## 2011-06-21 LAB — URINALYSIS, ROUTINE W REFLEX MICROSCOPIC
Glucose, UA: NEGATIVE mg/dL
Ketones, ur: >80 mg/dL — AB
Leukocytes, UA: NEGATIVE
Protein, ur: NEGATIVE mg/dL
Urobilinogen, UA: 0.2 mg/dL (ref 0.0–1.0)

## 2011-06-21 MED ORDER — SODIUM CHLORIDE 0.9 % IV SOLN
Freq: Once | INTRAVENOUS | Status: AC
  Administered 2011-06-21: 11:00:00 via INTRAVENOUS

## 2011-06-21 NOTE — Progress Notes (Signed)
Spoke to ED RN, patient to be transferred to [facility] for further evaluation to r/o preterm labor.

## 2011-06-21 NOTE — ED Notes (Signed)
Pt states abdominal pain began at 0100. 2nd pregnancy. 1st child was born at 52 weeks. Pt states she did not have enough gas to go to [place] where she is to deliver.

## 2011-06-21 NOTE — ED Notes (Signed)
Pt states she is having pain in her abdomen. I notified Dr. Estell Harpin.

## 2011-06-21 NOTE — ED Provider Notes (Signed)
This chart was scribed for Joann Lennert, MD by Wallis Mart. The patient was seen in room APA01/APA01 and the patient's care was started at 9:21 AM.   CSN: [id number]  Arrival date & time 06/21/11  0907   First MD Initiated Contact with Patient 06/21/11 [phone]      Chief Complaint  Patient presents with  . Abdominal pain/ [redacted] weeks pregnant     (Consider location/radiation/quality/duration/timing/severity/associated sxs/prior treatment) Patient is a 26 y.o. female presenting with abdominal pain. The history is provided by the patient (pt complains of abd pain).  Abdominal Pain The primary symptoms of the illness include abdominal pain. The primary symptoms of the illness do not include fatigue or diarrhea. The current episode started 6 to 12 hours ago. The onset of the illness was sudden. The problem has been gradually worsening.  Associated with: nothing. The patient states that she believes she is currently pregnant. Symptoms associated with the illness do not include chills, hematuria, frequency or back pain. Significant associated medical issues do not include PUD.   Joann Elliott is a 26 y.o. female who presents to the Emergency Department complaining of c/o sudden onset, intermittent, gradually worsening, moderate to severe suprapubic abdominal pain onset 1 AM. The pain radiates nowhere. Pt is [redacted] weeks pregnant.  This is pt's 2nd pregnancy, 1st child was born at 40 weeks.  Pt denies water breaking.  There are no other associated symptoms and no other alleviating or aggravating factors.   Past Medical History  Diagnosis Date  . Pregnant state, incidental     History reviewed. No pertinent past surgical history.  No family history on file.  History  Substance Use Topics  . Smoking status: Never Smoker   . Smokeless tobacco: Not on file  . Alcohol Use: No    OB History    Grav Para Term Preterm Abortions TAB SAB Ect Mult Living   3 2        1       Review of Systems   Constitutional: Negative for chills and fatigue.  HENT: Negative for congestion, sinus pressure and ear discharge.   Eyes: Negative for discharge.  Respiratory: Negative for cough.   Cardiovascular: Negative for chest pain.  Gastrointestinal: Positive for abdominal pain. Negative for diarrhea.  Genitourinary: Negative for frequency and hematuria.  Musculoskeletal: Negative for back pain.  Skin: Negative for rash.  Neurological: Negative for seizures and headaches.  Hematological: Negative.   Psychiatric/Behavioral: Negative for hallucinations.    10 Systems reviewed and all are negative for acute change except as noted in the HPI.    Allergies  Review of patient's allergies indicates no known allergies.  Home Medications   Current Outpatient Rx  Name Route Sig Dispense Refill  . PRENATAL 27-0.8 MG PO TABS Oral Take 1 tablet by mouth daily.        LMP 10/18/2010  Physical Exam  Constitutional: She is oriented to person, place, and time. She appears well-developed.  HENT:  Head: Normocephalic and atraumatic.  Eyes: Conjunctivae and EOM are normal. No scleral icterus.  Neck: Neck supple. No thyromegaly present.  Cardiovascular: Normal rate and regular rhythm.  Exam reveals no gallop and no friction rub.   No murmur heard. Pulmonary/Chest: No stridor. She has no wheezes. She has no rales. She exhibits no tenderness.  Abdominal: She exhibits no distension. There is tenderness. There is no rebound.       Mild suprapubic tenderness  Genitourinary:  Uterus consistent w/ 35 weeks pregmamt, cervix is 0% effaced, 1 cm dilated, vertex presentation  Musculoskeletal: Normal range of motion. She exhibits no edema.  Lymphadenopathy:    She has no cervical adenopathy.  Neurological: She is oriented to person, place, and time. Coordination normal.  Skin: No rash noted. No erythema.  Psychiatric: She has a normal mood and affect. Her behavior is normal.    ED Course  Procedures  (including critical care time) DIAGNOSTIC STUDIES: Oxygen Saturation is 98% on room air, normal by my interpretation.    10:58 AM: Pt to be transferred to Dr. Donzetta Matters.  COORDINATION OF CARE:    Labs Reviewed  URINALYSIS, ROUTINE W REFLEX MICROSCOPIC - Abnormal; Notable for the following:    Specific Gravity, Urine <1.005 (*)    Ketones, ur >80 (*)    All other components within normal limits   No results found.   No diagnosis found.    MDM   abd pain and pregnancy  The chart was scribed for me under my direct supervision.  I personally performed the history, physical, and medical decision making and all procedures in the evaluation of this patient.Joann Lennert, MD 06/21/11 [phone]

## 2011-06-21 NOTE — ED Notes (Signed)
RN spoke with Cala Bradford RN at [facility]. No contraction seen on monitor. No hardening of abdomen noted.

## 2011-06-21 NOTE — Significant Event (Signed)
Rapid Response Event Note  Overview:      Initial Focused Assessment:   Interventions:   Event Summary:   at      at    Outcome: Transferred (Comment) (Patient transferred to [facility] as she is patient there)  Event End Time: 1145  Karmyn Lowman, Milbert Coulter

## 2013-05-31 ENCOUNTER — Emergency Department (HOSPITAL_COMMUNITY)
Admission: EM | Admit: 2013-05-31 | Discharge: 2013-05-31 | Disposition: A | Discharge status: Home / Self Care | Payer: Medicaid Other | Attending: Emergency Medicine | Admitting: Emergency Medicine

## 2013-05-31 ENCOUNTER — Emergency Department (HOSPITAL_COMMUNITY): Payer: Medicaid Other

## 2013-05-31 ENCOUNTER — Encounter (HOSPITAL_COMMUNITY): Payer: Self-pay | Admitting: Emergency Medicine

## 2013-05-31 DIAGNOSIS — R071 Chest pain on breathing: Secondary | ICD-10-CM | POA: Insufficient documentation

## 2013-05-31 DIAGNOSIS — R0789 Other chest pain: Secondary | ICD-10-CM

## 2013-05-31 DIAGNOSIS — Z3202 Encounter for pregnancy test, result negative: Secondary | ICD-10-CM | POA: Insufficient documentation

## 2013-05-31 LAB — POC URINE PREG, ED: PREG TEST UR: NEGATIVE

## 2013-05-31 MED ORDER — IBUPROFEN 600 MG PO TABS: 600.0000 mg | ORAL_TABLET | Freq: Four times a day (QID) | ORAL | Status: DC | PRN

## 2013-05-31 MED ORDER — KETOROLAC TROMETHAMINE 60 MG/2ML IM SOLN
60.0000 mg | Freq: Once | INTRAMUSCULAR | Status: AC
  Administered 2013-05-31: 60 mg via INTRAMUSCULAR
  Filled 2013-05-31: qty 2

## 2013-05-31 NOTE — ED Notes (Signed)
Pt c/o pain under left breast yesterday. Pain worse palpation and deep breathing. Pt c/o pain to left side of neck today that radiated down left arm with numbness. Pt able to feel touch and rom wnl. Nad. denies sob/dizziness/n/v/d. Pt is non diaphoretic.
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
## 2013-05-31 NOTE — ED Notes (Signed)
See triage notes

## 2013-05-31 NOTE — ED Provider Notes (Signed)
  Medical screening examination/treatment/procedure(s) were performed by non-physician practitioner and as supervising physician I was immediately available for consultation/collaboration.   EKG Interpretation None         Monesha Monreal, MD 05/31/13 1430 

## 2013-05-31 NOTE — Discharge Instructions (Signed)
Chest Wall Pain Chest wall pain is pain felt in or around the chest bones and muscles. It may take up to 6 weeks to get better. It may take longer if you are active. Chest wall pain can happen on its own. Other times, things like germs, injury, coughing, or exercise can cause the pain. HOME CARE   Avoid activities that make you tired or cause pain. Try not to use your chest, belly (abdominal), or side muscles. Do not use heavy weights.  Put ice on the sore area.  Put ice in a plastic bag.  Place a towel between your skin and the bag.  Leave the ice on for 15-20 minutes for the first 2 days.  Only take medicine as told by your doctor. GET HELP RIGHT AWAY IF:   You have more pain or are very uncomfortable.  You have a fever.  Your chest pain gets worse.  You have new problems.  You feel sick to your stomach (nauseous) or throw up (vomit).  You start to sweat or feel lightheaded.  You have a cough with mucus (phlegm).  You cough up blood. MAKE SURE YOU:   Understand these instructions.  Will watch your condition.  Will get help right away if you are not doing well or get worse. Document Released: 07/15/2007 Document Revised: 04/20/2011 Document Reviewed: 09/22/2010 [facility] Patient Information 2014 [place], [place].   In addition to ibuprofen you may find a heating pad applied to your chest wall 20 minutes several times daily may be soothing.  Call your doctor for a recheck if your symptoms are not improving or for any worsened or new symptoms.

## 2013-06-12 NOTE — ED Provider Notes (Signed)
  Medical screening examination/treatment/procedure(s) were performed by non-physician practitioner and as supervising physician I was immediately available for consultation/collaboration.   EKG Interpretation   Date/Time:  Wednesday May 31 2013 10:46:39 EDT Ventricular Rate:  63 PR Interval:  134 QRS Duration: 80 QT Interval:  406 QTC Calculation: 415 R Axis:   68 Text Interpretation:  Sinus rhythm with marked sinus arrhythmia Otherwise  normal ECG When compared with ECG of 20-Nov-2009 20:52, No significant  change was found ED PHYSICIAN INTERPRETATION AVAILABLE IN [facility]  Confirmed by TEST, Record ([id number]) on 06/02/2013 7:11:05 AM         Gerhard Munchobert Railyn House, MD 06/12/13 [phone]

## 2013-12-11 ENCOUNTER — Encounter (HOSPITAL_COMMUNITY): Payer: Self-pay | Admitting: Emergency Medicine

## 2015-10-29 ENCOUNTER — Emergency Department (HOSPITAL_COMMUNITY)
Admission: EM | Admit: 2015-10-29 | Discharge: 2015-10-29 | Disposition: A | Discharge status: Home / Self Care | Payer: Medicaid Other | Attending: Emergency Medicine | Admitting: Emergency Medicine

## 2015-10-29 ENCOUNTER — Encounter (HOSPITAL_COMMUNITY): Payer: Self-pay | Admitting: Emergency Medicine

## 2015-10-29 DIAGNOSIS — S31823A Puncture wound without foreign body of left buttock, initial encounter: Secondary | ICD-10-CM | POA: Insufficient documentation

## 2015-10-29 DIAGNOSIS — S81832A Puncture wound without foreign body, left lower leg, initial encounter: Secondary | ICD-10-CM | POA: Diagnosis not present

## 2015-10-29 DIAGNOSIS — Y9389 Activity, other specified: Secondary | ICD-10-CM | POA: Diagnosis not present

## 2015-10-29 DIAGNOSIS — Y92007 Garden or yard of unspecified non-institutional (private) residence as the place of occurrence of the external cause: Secondary | ICD-10-CM | POA: Insufficient documentation

## 2015-10-29 DIAGNOSIS — Y999 Unspecified external cause status: Secondary | ICD-10-CM | POA: Insufficient documentation

## 2015-10-29 DIAGNOSIS — W540XXA Bitten by dog, initial encounter: Secondary | ICD-10-CM | POA: Diagnosis not present

## 2015-10-29 DIAGNOSIS — S31825A Open bite of left buttock, initial encounter: Secondary | ICD-10-CM | POA: Diagnosis present

## 2015-10-29 DIAGNOSIS — Z23 Encounter for immunization: Secondary | ICD-10-CM | POA: Diagnosis not present

## 2015-10-29 MED ORDER — HYDROCODONE-ACETAMINOPHEN 5-325 MG PO TABS: 1.0000 | ORAL_TABLET | ORAL | 0 refills | Status: DC | PRN

## 2015-10-29 MED ORDER — ONDANSETRON HCL 4 MG PO TABS
4.0000 mg | ORAL_TABLET | Freq: Once | ORAL | Status: AC
  Administered 2015-10-29: 4 mg via ORAL
  Filled 2015-10-29: qty 1

## 2015-10-29 MED ORDER — HYDROCODONE-ACETAMINOPHEN 5-325 MG PO TABS
1.0000 | ORAL_TABLET | Freq: Once | ORAL | Status: AC
  Administered 2015-10-29: 1 via ORAL
  Filled 2015-10-29: qty 1

## 2015-10-29 MED ORDER — AMOXICILLIN-POT CLAVULANATE 875-125 MG PO TABS: 1.0000 | ORAL_TABLET | Freq: Two times a day (BID) | ORAL | 0 refills | Status: DC

## 2015-10-29 MED ORDER — TETANUS-DIPHTH-ACELL PERTUSSIS 5-2.5-18.5 LF-MCG/0.5 IM SUSP
0.5000 mL | Freq: Once | INTRAMUSCULAR | Status: AC
  Administered 2015-10-29: 0.5 mL via INTRAMUSCULAR
  Filled 2015-10-29: qty 0.5

## 2015-10-29 MED ORDER — AMOXICILLIN-POT CLAVULANATE 875-125 MG PO TABS
1.0000 | ORAL_TABLET | Freq: Once | ORAL | Status: AC
  Administered 2015-10-29: 1 via ORAL
  Filled 2015-10-29: qty 1
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
## 2015-10-29 NOTE — ED Provider Notes (Signed)
[facility]-EMERGENCY DEPT Provider Note   CSN: [id number] Arrival date & time: 10/29/15  1013     History   Chief Complaint Chief Complaint  Patient presents with  . Animal Bite    HPI Joann Elliott is a 30 y.o. female.  Patient is a 30 year old female who presents to the emergency department following a dog bite to the left thigh and buttocks.  The patient states that she went into her neighbors on yard to ask him a question, when the neighbors. Pit Bull bit her on her in calf as well as on her buttocks area. The neighbor reports to her that the dog is up-to-date on immunizations. She denies any other bites or injuries. She is on clear on the date of her last tetanus. She is able to control the bleeding with bandaging and direct pressure. Movement and palpation increase the pain and discomfort. Nothing really makes the pain any better.      History reviewed. No pertinent past medical history.  There are no active problems to display for this patient.   History reviewed. No pertinent surgical history.  OB History    Gravida Para Term Preterm AB Living   3 3       3    SAB TAB Ectopic Multiple Live Births                   Home Medications    Prior to Admission medications   Medication Sig Start Date End Date Taking? Authorizing Provider  amoxicillin-clavulanate (AUGMENTIN) 875-125 MG tablet Take 1 tablet by mouth every 12 (twelve) hours. 10/29/15   Ivery Quale, PA-C  HYDROcodone-acetaminophen (NORCO/VICODIN) 5-325 MG tablet Take 1 tablet by mouth every 4 (four) hours as needed. 10/29/15   Ivery Quale, PA-C    Family History History reviewed. No pertinent family history.  Social History Social History  Substance Use Topics  . Smoking status: Never Smoker  . Smokeless tobacco: Never Used  . Alcohol use No     Allergies   Review of patient's allergies indicates no known allergies.   Review of Systems Review of Systems  Constitutional: Negative for activity  change and appetite change.  HENT: Negative for congestion, ear discharge, ear pain, facial swelling, nosebleeds, rhinorrhea, sneezing and tinnitus.   Eyes: Negative for photophobia, pain and discharge.  Respiratory: Negative for cough, choking, shortness of breath and wheezing.   Cardiovascular: Negative for chest pain, palpitations and leg swelling.  Gastrointestinal: Negative for abdominal pain, blood in stool, constipation, diarrhea, nausea and vomiting.  Genitourinary: Negative for difficulty urinating, dysuria, flank pain, frequency and hematuria.  Musculoskeletal: Negative for back pain, gait problem, myalgias and neck pain.  Skin: Negative for color change, rash and wound.  Neurological: Negative for dizziness, seizures, syncope, facial asymmetry, speech difficulty, weakness and numbness.  Hematological: Negative for adenopathy. Does not bruise/bleed easily.  Psychiatric/Behavioral: Negative for agitation, confusion, hallucinations, self-injury and suicidal ideas. The patient is not nervous/anxious.      Physical Exam Updated Vital Signs BP 115/80 (BP Location: Left Arm)   Pulse 106   Temp 98.6 F (37 C) (Oral)   Resp 16   Ht 5\' 2"  (1.575 m)   Wt 63.5 kg   SpO2 100%   BMI 25.61 kg/m   Physical Exam  Constitutional: She appears well-developed and well-nourished. No distress.  HENT:  Head: Normocephalic and atraumatic.  Right Ear: External ear normal.  Left Ear: External ear normal.  Eyes: Conjunctivae are normal. Right eye exhibits  no discharge. Left eye exhibits no discharge. No scleral icterus.  Neck: Neck supple. No tracheal deviation present.  Cardiovascular: Normal rate, regular rhythm and intact distal pulses.   Pulmonary/Chest: Effort normal and breath sounds normal. No stridor. No respiratory distress. She has no wheezes. She has no rales.  Abdominal: Soft. Bowel sounds are normal. She exhibits no distension. There is no tenderness. There is no rebound and no  guarding.  Genitourinary:  Genitourinary Comments: There are 2 puncture/laceration wounds to the left buttocks cheek. Bleeding is controlled. The vagina is not involved.  Musculoskeletal: She exhibits tenderness. She exhibits no edema.  There are 2 puncture/laceration wounds to the mid calf of the left lower extremity. There is an abrasion adjacent to the 2 puncture wounds. There is a third puncture/laceration at the more lateral portion of the calf. There is full range of motion of the toes, ankle, and knee. The Achilles tendon is intact. The dorsalis pedis pulses 2+ bilaterally.  Neurological: She is alert. She has normal strength. No cranial nerve deficit (no facial droop, extraocular movements intact, no slurred speech) or sensory deficit. She exhibits normal muscle tone. She displays no seizure activity. Coordination normal.  Skin: Skin is warm and dry. No rash noted.  Psychiatric: She has a normal mood and affect.  Nursing note and vitals reviewed.    ED Treatments / Results  Labs (all labs ordered are listed, but only abnormal results are displayed) Labs Reviewed - No data to display  EKG  EKG Interpretation None       Radiology No results found.  Procedures Procedures (including critical care time)  Medications Ordered in ED Medications  HYDROcodone-acetaminophen (NORCO/VICODIN) 5-325 MG per tablet 1 tablet (1 tablet Oral Given 10/29/15 1116)  amoxicillin-clavulanate (AUGMENTIN) 875-125 MG per tablet 1 tablet (1 tablet Oral Given 10/29/15 1116)  Tdap (BOOSTRIX) injection 0.5 mL (0.5 mLs Intramuscular Given 10/29/15 1117)  ondansetron (ZOFRAN) tablet 4 mg (4 mg Oral Given 10/29/15 1116)     Initial Impression / Assessment and Plan / ED Course  I have reviewed the triage vital signs and the nursing notes.  Pertinent labs & imaging results that were available during my care of the patient were reviewed by me and considered in my medical decision making (see chart for  details).  Clinical Course    *I have reviewed nursing notes, vital signs, and all appropriate lab and imaging results for this patient.**  Final Clinical Impressions(s) / ED Diagnoses  Animal control has been called concerning the dog bite incident.  The patient's tetanus status was updated.  Patient will be treated with Augmentin, ibuprofen, and Norco for more severe pain. The patient will cleanse the wounds daily with soap and water and apply dressing until healed. The patient is to see her primary physician or return to the emergency department if any changes, problems, or concerns.    Final diagnoses:  Dog bite    New Prescriptions New Prescriptions   AMOXICILLIN-CLAVULANATE (AUGMENTIN) 875-125 MG TABLET    Take 1 tablet by mouth every 12 (twelve) hours.   HYDROCODONE-ACETAMINOPHEN (NORCO/VICODIN) 5-325 MG TABLET    Take 1 tablet by mouth every 4 (four) hours as needed.     Ivery QualeHobson Asiyah Pineau, PA-C 10/29/15 1226    Nira ConnPedro Eduardo Cardama, MD 10/31/15 [phone]

## 2015-10-29 NOTE — ED Triage Notes (Signed)
Pt states that she was bite by a neighbors dog. Pt states that the neighbor stated that the dog was vaccinated. Pt was bit on left lower leg and left butt cheek. Pt was able to ambulate through front door.

## 2015-10-29 NOTE — ED Notes (Signed)
Bandages placed to left lateral leg and to left buttocks, bleeding controlled at this time, pt in wheelchair.

## 2015-10-29 NOTE — ED Notes (Signed)
Animal Control contacted and bite reported.

## 2015-10-29 NOTE — Discharge Instructions (Signed)
Please cleanse her wounds daily with soap and water. Apply a bandage until the wounds have healed. Use Augmentin 2 times daily with food. May use Norco every 4 hours if needed for pain not controlled by Tylenol or ibuprofen. Norco may cause drowsiness, please use this medication with caution. Animal control has been called concerning the dog bite. They will observe the offending animal, and contact you if there are any problems.

## 2017-05-14 ENCOUNTER — Other Ambulatory Visit: Payer: Self-pay | Admitting: Orthopedic Surgery

## 2017-05-14 DIAGNOSIS — M533 Sacrococcygeal disorders, not elsewhere classified: Secondary | ICD-10-CM

## 2017-05-26 ENCOUNTER — Ambulatory Visit
Admission: RE | Admit: 2017-05-26 | Discharge: 2017-05-26 | Disposition: A | Discharge status: Home / Self Care | Payer: Self-pay | Source: Ambulatory Visit | Attending: Orthopedic Surgery | Admitting: Orthopedic Surgery

## 2017-05-26 ENCOUNTER — Ambulatory Visit
Admission: RE | Admit: 2017-05-26 | Discharge: 2017-05-26 | Disposition: A | Discharge status: Home / Self Care | Payer: Worker's Compensation | Source: Ambulatory Visit | Attending: Orthopedic Surgery | Admitting: Orthopedic Surgery

## 2017-05-26 DIAGNOSIS — M533 Sacrococcygeal disorders, not elsewhere classified: Secondary | ICD-10-CM

## 2018-06-28 ENCOUNTER — Other Ambulatory Visit: Payer: Self-pay | Admitting: Orthopedic Surgery

## 2018-07-11 NOTE — Pre-Procedure Instructions (Signed)
Joann Elliott  07/11/2018      [id number] - [place], [place] - [address] AT [facility] [address] [place] [address] Phone: [phone] Fax: [phone]    Your procedure is scheduled on July 14, 2018.  Report to [facility] Entrance "A" at 800 AM.  Call this number if you have problems the morning of surgery:  [phone]   Remember:  Do not eat after midnight.  You may drink clear liquids until 430 AM.  Clear liquids allowed are: Ensure Pre-surgery Drink, Water, Juice (non-citric and without pulp), Clear Tea, Black Coffee only and Gatorade. Please Finish your Ensure Pre-surgery Drink by 430 AM the morning of surgery    Take these medicines the morning of surgery with A SIP OF WATER  Augmentin  Hydrocodone-acetaminophen (norco)-if needed  Beginning now, STOP taking any Aspirin (unless otherwise instructed by your surgeon), Aleve, Naproxen, Ibuprofen, Motrin, Advil, Goody's, BC's, all herbal medications, fish oil, and all vitamins     Do not wear jewelry, make-up or nail polish.  Do not wear lotions, powders, or perfumes, or deodorant.  Do not shave 48 hours prior to surgery.    Do not bring valuables to the hospital.  [facility] is not responsible for any belongings or valuables.  Contacts, dentures or bridgework may not be worn into surgery.  Leave your suitcase in the car.  After surgery it may be brought to your room.  For patients admitted to the hospital, discharge time will be determined by your treatment team.  Patients discharged the day of surgery will not be allowed to drive home.    [place]- Preparing For Surgery  Before surgery, you can play an important role. Because skin is not sterile, your skin needs to be as free of germs as possible. You can reduce the number of germs on your skin by washing with CHG (chlorahexidine gluconate) Soap before surgery.  CHG is an antiseptic cleaner which kills germs and bonds with the skin to  continue killing germs even after washing.    Oral Hygiene is also important to reduce your risk of infection.  Remember - BRUSH YOUR TEETH THE MORNING OF SURGERY WITH YOUR REGULAR TOOTHPASTE  Please do not use if you have an allergy to CHG or antibacterial soaps. If your skin becomes reddened/irritated stop using the CHG.  Do not shave (including legs and underarms) for at least 48 hours prior to first CHG shower. It is OK to shave your face.  Please follow these instructions carefully.   1. Shower the NIGHT BEFORE SURGERY and the MORNING OF SURGERY with CHG.   2. If you chose to wash your hair, wash your hair first as usual with your normal shampoo.  3. After you shampoo, rinse your hair and body thoroughly to remove the shampoo.  4. Use CHG as you would any other liquid soap. You can apply CHG directly to the skin and wash gently with a scrungie or a clean washcloth.   5. Apply the CHG Soap to your body ONLY FROM THE NECK DOWN.  Do not use on open wounds or open sores. Avoid contact with your eyes, ears, mouth and genitals (private parts). Wash Face and genitals (private parts)  with your normal soap.  6. Wash thoroughly, paying special attention to the area where your surgery will be performed.  7. Thoroughly rinse your body with warm water from the neck down.  8. DO NOT shower/wash with your  normal soap after using and rinsing off the CHG Soap.  9. Pat yourself dry with a CLEAN TOWEL.  10. Wear CLEAN PAJAMAS to bed the night before surgery, wear comfortable clothes the morning of surgery  11. Place CLEAN SHEETS on your bed the night of your first shower and DO NOT SLEEP WITH PETS.   Day of Surgery:  Do not apply any deodorants/lotions.  Please wear clean clothes to the hospital/surgery center.   Remember to brush your teeth WITH YOUR REGULAR TOOTHPASTE.   Please read over the following fact sheets that you were given.

## 2018-07-12 ENCOUNTER — Encounter (HOSPITAL_COMMUNITY): Payer: Self-pay

## 2018-07-12 ENCOUNTER — Encounter (HOSPITAL_COMMUNITY)
Admission: RE | Admit: 2018-07-12 | Discharge: 2018-07-12 | Disposition: A | Discharge status: Home / Self Care | Payer: Medicaid Other | Source: Ambulatory Visit | Attending: Orthopedic Surgery | Admitting: Orthopedic Surgery

## 2018-07-12 ENCOUNTER — Other Ambulatory Visit: Payer: Self-pay

## 2018-07-12 ENCOUNTER — Other Ambulatory Visit (HOSPITAL_COMMUNITY)
Admission: RE | Admit: 2018-07-12 | Discharge: 2018-07-12 | Disposition: A | Discharge status: Home / Self Care | Payer: Medicaid Other | Source: Ambulatory Visit | Attending: Orthopedic Surgery | Admitting: Orthopedic Surgery

## 2018-07-12 DIAGNOSIS — Z01812 Encounter for preprocedural laboratory examination: Secondary | ICD-10-CM | POA: Insufficient documentation

## 2018-07-12 DIAGNOSIS — Z1159 Encounter for screening for other viral diseases: Secondary | ICD-10-CM | POA: Insufficient documentation

## 2018-07-12 HISTORY — DX: Myoneural disorder, unspecified: G70.9

## 2018-07-12 HISTORY — DX: Headache, unspecified: R51.9

## 2018-07-12 LAB — URINALYSIS, ROUTINE W REFLEX MICROSCOPIC
Bilirubin Urine: NEGATIVE
Glucose, UA: NEGATIVE mg/dL
Ketones, ur: NEGATIVE mg/dL
Leukocytes,Ua: NEGATIVE
Nitrite: NEGATIVE
Protein, ur: NEGATIVE mg/dL
Specific Gravity, Urine: 1.02 (ref 1.005–1.030)
pH: 5 (ref 5.0–8.0)

## 2018-07-12 LAB — CBC WITH DIFFERENTIAL/PLATELET
Abs Immature Granulocytes: 0.01 10*3/uL (ref 0.00–0.07)
Basophils Absolute: 0 10*3/uL (ref 0.0–0.1)
Basophils Relative: 1 %
Eosinophils Absolute: 0 10*3/uL (ref 0.0–0.5)
Eosinophils Relative: 1 %
HCT: 38.9 % (ref 36.0–46.0)
Hemoglobin: 12.1 g/dL (ref 12.0–15.0)
Immature Granulocytes: 0 %
Lymphocytes Relative: 37 %
Lymphs Abs: 1.3 10*3/uL (ref 0.7–4.0)
MCH: 27.6 pg (ref 26.0–34.0)
MCHC: 31.1 g/dL (ref 30.0–36.0)
MCV: 88.8 fL (ref 80.0–100.0)
Monocytes Absolute: 0.4 10*3/uL (ref 0.1–1.0)
Monocytes Relative: 12 %
Neutro Abs: 1.7 10*3/uL (ref 1.7–7.7)
Neutrophils Relative %: 49 %
Platelets: 274 10*3/uL (ref 150–400)
RBC: 4.38 MIL/uL (ref 3.87–5.11)
RDW: 13.2 % (ref 11.5–15.5)
WBC: 3.4 10*3/uL — ABNORMAL LOW (ref 4.0–10.5)
nRBC: 0 % (ref 0.0–0.2)

## 2018-07-12 LAB — COMPREHENSIVE METABOLIC PANEL
ALT: 18 U/L (ref 0–44)
AST: 20 U/L (ref 15–41)
Albumin: 3.7 g/dL (ref 3.5–5.0)
Alkaline Phosphatase: 52 U/L (ref 38–126)
Anion gap: 8 (ref 5–15)
BUN: 9 mg/dL (ref 6–20)
CO2: 24 mmol/L (ref 22–32)
Calcium: 9.1 mg/dL (ref 8.9–10.3)
Chloride: 105 mmol/L (ref 98–111)
Creatinine, Ser: 0.67 mg/dL (ref 0.44–1.00)
GFR calc Af Amer: >60 mL/min (ref >60)
GFR calc non Af Amer: >60 mL/min (ref >60)
Glucose, Bld: 93 mg/dL (ref 70–99)
Potassium: 3.8 mmol/L (ref 3.5–5.1)
Sodium: 137 mmol/L (ref 135–145)
Total Bilirubin: 0.6 mg/dL (ref 0.3–1.2)
Total Protein: 6.8 g/dL (ref 6.5–8.1)

## 2018-07-12 LAB — PROTIME-INR
INR: 1.1 (ref 0.8–1.2)
Prothrombin Time: 14.2 seconds (ref 11.4–15.2)

## 2018-07-12 LAB — TYPE AND SCREEN
ABO/RH(D): O POS
Antibody Screen: NEGATIVE

## 2018-07-12 LAB — SURGICAL PCR SCREEN
MRSA, PCR: NEGATIVE
Staphylococcus aureus: NEGATIVE

## 2018-07-12 LAB — APTT: aPTT: 31 seconds (ref 24–36)

## 2018-07-12 NOTE — Progress Notes (Addendum)
PCP -  [facility] Cardiologist - pt denies  Chest x-ray - pt denies EKG - pt denies  Stress Test - pt denies ECHO - pt denies  Cardiac Cath - pt denies  Sleep Study - no  CPAP - n/a  Fasting Blood Sugar - n/a Checks Blood Sugar _____ times a day-n/a  Blood Thinner Instructions: n/a Aspirin Instructions: n/a  Anesthesia review:  Pending labs  Patient denies shortness of breath, fever, cough and chest pain at PAT appointment  Patient verbalized understanding of instructions that were given to them at the PAT appointment. Patient was also instructed that they will need to review over the PAT instructions again at home before surgery.   Coronavirus Screening  Have you experienced the following symptoms:  Cough yes/no: No Fever (>100.11F)  yes/no: No Runny nose yes/no: No Sore throat yes/no: No Difficulty breathing/shortness of breath  yes/no: No  Have you or a family member traveled in the last 14 days and where? yes/no: No

## 2018-07-13 LAB — NOVEL CORONAVIRUS, NAA (HOSP ORDER, SEND-OUT TO REF LAB; TAT 18-24 HRS): SARS-CoV-2, NAA: NOT DETECTED

## 2018-07-13 LAB — ABO/RH: ABO/RH(D): O POS

## 2018-07-13 NOTE — Anesthesia Preprocedure Evaluation (Addendum)
Anesthesia Evaluation  Patient identified by MRN, date of birth, ID band Patient awake    Reviewed: Allergy & Precautions, NPO status , Patient's Chart, lab work & pertinent test results  History of Anesthesia Complications Negative for: history of anesthetic complications  Airway Mallampati: II  TM Distance: >3 FB Neck ROM: Full    Dental  (+) Chipped,    Pulmonary neg pulmonary ROS,    Pulmonary exam normal        Cardiovascular negative cardio ROS Normal cardiovascular exam     Neuro/Psych  Headaches,    GI/Hepatic negative GI ROS, Neg liver ROS,   Endo/Other  negative endocrine ROS  Renal/GU negative Renal ROS     Musculoskeletal negative musculoskeletal ROS (+) SI joint dysfunction   Abdominal   Peds  Hematology negative hematology ROS (+)   Anesthesia Other Findings Day of surgery medications reviewed with the patient.  Reproductive/Obstetrics                            Anesthesia Physical Anesthesia Plan  ASA: II  Anesthesia Plan: General   Post-op Pain Management:    Induction: Intravenous  PONV Risk Score and Plan: 4 or greater and Treatment may vary due to age or medical condition, Ondansetron, Dexamethasone, Midazolam and Scopolamine patch - Pre-op  Airway Management Planned: Oral ETT  Additional Equipment:   Intra-op Plan:   Post-operative Plan: Extubation in OR  Informed Consent: I have reviewed the patients History and Physical, chart, labs and discussed the procedure including the risks, benefits and alternatives for the proposed anesthesia with the patient or authorized representative who has indicated his/her understanding and acceptance.     Dental advisory given  Plan Discussed with: CRNA  Anesthesia Plan Comments:        Anesthesia Quick Evaluation

## 2018-07-14 ENCOUNTER — Ambulatory Visit (HOSPITAL_COMMUNITY): Payer: Medicaid Other | Admitting: Physician Assistant

## 2018-07-14 ENCOUNTER — Ambulatory Visit (HOSPITAL_COMMUNITY): Payer: Medicaid Other

## 2018-07-14 ENCOUNTER — Other Ambulatory Visit (HOSPITAL_COMMUNITY): Payer: Medicaid Other

## 2018-07-14 ENCOUNTER — Other Ambulatory Visit: Payer: Self-pay

## 2018-07-14 ENCOUNTER — Encounter (HOSPITAL_COMMUNITY): Payer: Self-pay

## 2018-07-14 ENCOUNTER — Ambulatory Visit (HOSPITAL_COMMUNITY): Payer: Medicaid Other | Admitting: Anesthesiology

## 2018-07-14 ENCOUNTER — Ambulatory Visit (HOSPITAL_COMMUNITY)
Admission: RE | Admit: 2018-07-14 | Discharge: 2018-07-14 | Disposition: A | Discharge status: Home / Self Care | Payer: Medicaid Other | Attending: Orthopedic Surgery | Admitting: Orthopedic Surgery

## 2018-07-14 ENCOUNTER — Encounter (HOSPITAL_COMMUNITY)
Admission: RE | Disposition: A | Discharge status: Home / Self Care | Payer: Self-pay | Source: Home / Self Care | Attending: Orthopedic Surgery

## 2018-07-14 DIAGNOSIS — Z7982 Long term (current) use of aspirin: Secondary | ICD-10-CM | POA: Insufficient documentation

## 2018-07-14 DIAGNOSIS — M533 Sacrococcygeal disorders, not elsewhere classified: Secondary | ICD-10-CM | POA: Insufficient documentation

## 2018-07-14 DIAGNOSIS — R51 Headache: Secondary | ICD-10-CM | POA: Insufficient documentation

## 2018-07-14 DIAGNOSIS — Z79899 Other long term (current) drug therapy: Secondary | ICD-10-CM | POA: Insufficient documentation

## 2018-07-14 DIAGNOSIS — Z419 Encounter for procedure for purposes other than remedying health state, unspecified: Secondary | ICD-10-CM

## 2018-07-14 DIAGNOSIS — M545 Low back pain: Secondary | ICD-10-CM | POA: Insufficient documentation

## 2018-07-14 HISTORY — PX: SACROILIAC JOINT FUSION: SHX6088

## 2018-07-14 LAB — POCT PREGNANCY, URINE: Preg Test, Ur: NEGATIVE

## 2018-07-14 SURGERY — SACROILIAC JOINT FUSION
Anesthesia: General | Laterality: Right

## 2018-07-14 MED ORDER — SCOPOLAMINE 1 MG/3DAYS TD PT72
MEDICATED_PATCH | TRANSDERMAL | Status: AC
  Filled 2018-07-14: qty 1

## 2018-07-14 MED ORDER — BUPIVACAINE-EPINEPHRINE (PF) 0.25% -1:200000 IJ SOLN
INTRAMUSCULAR | Status: AC
  Filled 2018-07-14: qty 30

## 2018-07-14 MED ORDER — BUPIVACAINE-EPINEPHRINE (PF) 0.25% -1:200000 IJ SOLN
INTRAMUSCULAR | Status: DC | PRN
  Administered 2018-07-14: 30 mL via PERINEURAL

## 2018-07-14 MED ORDER — OXYCODONE HCL 5 MG/5ML PO SOLN: 5.0000 mg | Freq: Once | ORAL | Status: DC | PRN

## 2018-07-14 MED ORDER — LIDOCAINE 2% (20 MG/ML) 5 ML SYRINGE
INTRAMUSCULAR | Status: AC
  Filled 2018-07-14: qty 5

## 2018-07-14 MED ORDER — MIDAZOLAM HCL 5 MG/5ML IJ SOLN
INTRAMUSCULAR | Status: DC | PRN
  Administered 2018-07-14: 2 mg via INTRAVENOUS

## 2018-07-14 MED ORDER — FENTANYL CITRATE (PF) 250 MCG/5ML IJ SOLN
INTRAMUSCULAR | Status: AC
  Filled 2018-07-14: qty 5

## 2018-07-14 MED ORDER — MIDAZOLAM HCL 2 MG/2ML IJ SOLN
INTRAMUSCULAR | Status: AC
  Filled 2018-07-14: qty 2

## 2018-07-14 MED ORDER — HYDROMORPHONE HCL 1 MG/ML IJ SOLN
INTRAMUSCULAR | Status: AC
  Filled 2018-07-14: qty 1

## 2018-07-14 MED ORDER — PROMETHAZINE HCL 25 MG/ML IJ SOLN
INTRAMUSCULAR | Status: AC
  Filled 2018-07-14: qty 1

## 2018-07-14 MED ORDER — PHENYLEPHRINE 40 MCG/ML (10ML) SYRINGE FOR IV PUSH (FOR BLOOD PRESSURE SUPPORT)
PREFILLED_SYRINGE | INTRAVENOUS | Status: DC | PRN
  Administered 2018-07-14: 40 ug via INTRAVENOUS

## 2018-07-14 MED ORDER — ROCURONIUM BROMIDE 10 MG/ML (PF) SYRINGE
PREFILLED_SYRINGE | INTRAVENOUS | Status: DC | PRN
  Administered 2018-07-14: 70 mg via INTRAVENOUS

## 2018-07-14 MED ORDER — PHENYLEPHRINE 40 MCG/ML (10ML) SYRINGE FOR IV PUSH (FOR BLOOD PRESSURE SUPPORT)
PREFILLED_SYRINGE | INTRAVENOUS | Status: AC
  Filled 2018-07-14: qty 20

## 2018-07-14 MED ORDER — SCOPOLAMINE 1 MG/3DAYS TD PT72
MEDICATED_PATCH | TRANSDERMAL | Status: DC | PRN
  Administered 2018-07-14: 1 via TRANSDERMAL

## 2018-07-14 MED ORDER — FENTANYL CITRATE (PF) 100 MCG/2ML IJ SOLN
INTRAMUSCULAR | Status: DC | PRN
  Administered 2018-07-14: 100 ug via INTRAVENOUS
  Administered 2018-07-14: 50 ug via INTRAVENOUS
  Administered 2018-07-14: 100 ug via INTRAVENOUS

## 2018-07-14 MED ORDER — HYDROMORPHONE HCL 1 MG/ML IJ SOLN
0.2500 mg | INTRAMUSCULAR | Status: DC | PRN
  Administered 2018-07-14 (×4): 0.5 mg via INTRAVENOUS

## 2018-07-14 MED ORDER — ACETAMINOPHEN 10 MG/ML IV SOLN: 1000.0000 mg | Freq: Once | INTRAVENOUS | Status: DC | PRN

## 2018-07-14 MED ORDER — POVIDONE-IODINE 7.5 % EX SOLN: Freq: Once | CUTANEOUS | Status: DC

## 2018-07-14 MED ORDER — OXYCODONE-ACETAMINOPHEN 5-325 MG PO TABS: 1.0000 | ORAL_TABLET | ORAL | 0 refills | Status: AC | PRN

## 2018-07-14 MED ORDER — OXYCODONE HCL 5 MG PO TABS: 5.0000 mg | ORAL_TABLET | Freq: Once | ORAL | Status: DC | PRN

## 2018-07-14 MED ORDER — LIDOCAINE 2% (20 MG/ML) 5 ML SYRINGE
INTRAMUSCULAR | Status: DC | PRN
  Administered 2018-07-14: 60 mg via INTRAVENOUS

## 2018-07-14 MED ORDER — BUPIVACAINE LIPOSOME 1.3 % IJ SUSP
20.0000 mL | INTRAMUSCULAR | Status: AC
  Administered 2018-07-14: 20 mL
  Filled 2018-07-14: qty 20

## 2018-07-14 MED ORDER — PROMETHAZINE HCL 25 MG/ML IJ SOLN
6.2500 mg | INTRAMUSCULAR | Status: DC | PRN
  Administered 2018-07-14: 6.25 mg via INTRAVENOUS

## 2018-07-14 MED ORDER — SUGAMMADEX SODIUM 200 MG/2ML IV SOLN
INTRAVENOUS | Status: DC | PRN
  Administered 2018-07-14 (×2): 100 mg via INTRAVENOUS

## 2018-07-14 MED ORDER — PROPOFOL 10 MG/ML IV BOLUS
INTRAVENOUS | Status: DC | PRN
  Administered 2018-07-14: 40 mg via INTRAVENOUS

## 2018-07-14 MED ORDER — LACTATED RINGERS IV SOLN
INTRAVENOUS | Status: DC
  Administered 2018-07-14: 09:00:00 via INTRAVENOUS

## 2018-07-14 MED ORDER — ROCURONIUM BROMIDE 10 MG/ML (PF) SYRINGE
PREFILLED_SYRINGE | INTRAVENOUS | Status: AC
  Filled 2018-07-14: qty 10

## 2018-07-14 MED ORDER — CEFAZOLIN SODIUM-DEXTROSE 2-4 GM/100ML-% IV SOLN
2.0000 g | INTRAVENOUS | Status: AC
  Administered 2018-07-14: 2 g via INTRAVENOUS
  Filled 2018-07-14: qty 100

## 2018-07-14 MED ORDER — PROPOFOL 10 MG/ML IV BOLUS
INTRAVENOUS | Status: AC
  Filled 2018-07-14: qty 20

## 2018-07-14 MED ORDER — 0.9 % SODIUM CHLORIDE (POUR BTL) OPTIME
TOPICAL | Status: DC | PRN
  Administered 2018-07-14: 2000 mL

## 2018-07-14 MED ORDER — DIAZEPAM 5 MG PO TABS: 5.0000 mg | ORAL_TABLET | Freq: Four times a day (QID) | ORAL | 0 refills | Status: AC | PRN

## 2018-07-14 MED ORDER — DEXAMETHASONE SODIUM PHOSPHATE 10 MG/ML IJ SOLN
INTRAMUSCULAR | Status: DC | PRN
  Administered 2018-07-14: 5 mg via INTRAVENOUS

## 2018-07-14 MED ORDER — ONDANSETRON HCL 4 MG/2ML IJ SOLN
INTRAMUSCULAR | Status: DC | PRN
  Administered 2018-07-14: 4 mg via INTRAVENOUS

## 2018-07-14 SURGICAL SUPPLY — 63 items
APL SKNCLS STERI-STRIP NONHPOA (GAUZE/BANDAGES/DRESSINGS) ×1
BENZOIN TINCTURE PRP APPL 2/3 (GAUZE/BANDAGES/DRESSINGS) ×2 IMPLANT
BLADE CLIPPER SURG (BLADE) ×2 IMPLANT
BLADE SURG 10 STRL SS (BLADE) ×2 IMPLANT
CANISTER SUCT 3000ML PPV (MISCELLANEOUS) ×2 IMPLANT
CLSR STERI-STRIP ANTIMIC 1/2X4 (GAUZE/BANDAGES/DRESSINGS) ×1 IMPLANT
COVER SURGICAL LIGHT HANDLE (MISCELLANEOUS) ×4 IMPLANT
COVER WAND RF STERILE (DRAPES) ×2 IMPLANT
DRAPE C-ARM 42X72 X-RAY (DRAPES) ×2 IMPLANT
DRAPE C-ARMOR (DRAPES) ×2 IMPLANT
DRAPE INCISE IOBAN 66X45 STRL (DRAPES) ×2 IMPLANT
DRAPE POUCH INSTRU U-SHP 10X18 (DRAPES) ×2 IMPLANT
DRAPE SURG 17X23 STRL (DRAPES) ×6 IMPLANT
DURAPREP 26ML APPLICATOR (WOUND CARE) ×2 IMPLANT
ELECT CAUTERY BLADE 6.4 (BLADE) ×2 IMPLANT
ELECT REM PT RETURN 9FT ADLT (ELECTROSURGICAL) ×2
ELECTRODE REM PT RTRN 9FT ADLT (ELECTROSURGICAL) ×1 IMPLANT
GAUZE 4X4 16PLY RFD (DISPOSABLE) ×2 IMPLANT
GAUZE SPONGE 4X4 12PLY STRL (GAUZE/BANDAGES/DRESSINGS) ×2 IMPLANT
GLOVE BIO SURGEON STRL SZ7 (GLOVE) ×2 IMPLANT
GLOVE BIO SURGEON STRL SZ8 (GLOVE) ×2 IMPLANT
GLOVE BIOGEL PI IND STRL 7.0 (GLOVE) ×1 IMPLANT
GLOVE BIOGEL PI IND STRL 8 (GLOVE) ×1 IMPLANT
GLOVE BIOGEL PI INDICATOR 7.0 (GLOVE) ×2
GLOVE BIOGEL PI INDICATOR 8 (GLOVE) ×1
GOWN STRL REUS W/ TWL LRG LVL3 (GOWN DISPOSABLE) ×2 IMPLANT
GOWN STRL REUS W/ TWL XL LVL3 (GOWN DISPOSABLE) ×1 IMPLANT
GOWN STRL REUS W/TWL LRG LVL3 (GOWN DISPOSABLE) ×4
GOWN STRL REUS W/TWL XL LVL3 (GOWN DISPOSABLE) ×2
IMPL IFUSE 7.0MMX45MM (Rod) IMPLANT
IMPL IFUSE 7X35 (Rod) IMPLANT
IMPLANT IFUSE 7.0MMX40MM (Rod) ×2 IMPLANT
IMPLANT IFUSE 7.0MMX45MM (Rod) ×2 IMPLANT
IMPLANT IFUSE 7X35 (Rod) ×2 IMPLANT
KIT BASIN OR (CUSTOM PROCEDURE TRAY) ×2 IMPLANT
KIT TURNOVER KIT B (KITS) ×2 IMPLANT
MANIFOLD NEPTUNE II (INSTRUMENTS) ×2 IMPLANT
NDL HYPO 25GX1X1/2 BEV (NEEDLE) ×1 IMPLANT
NEEDLE 22X1 1/2 (OR ONLY) (NEEDLE) ×2 IMPLANT
NEEDLE HYPO 25GX1X1/2 BEV (NEEDLE) ×2 IMPLANT
NS IRRIG 1000ML POUR BTL (IV SOLUTION) ×2 IMPLANT
PACK UNIVERSAL I (CUSTOM PROCEDURE TRAY) ×2 IMPLANT
PAD ARMBOARD 7.5X6 YLW CONV (MISCELLANEOUS) ×4 IMPLANT
PENCIL BUTTON HOLSTER BLD 10FT (ELECTRODE) ×2 IMPLANT
SPONGE LAP 18X18 RF (DISPOSABLE) ×2 IMPLANT
STAPLER VISISTAT 35W (STAPLE) ×2 IMPLANT
STRIP CLOSURE SKIN 1/2X4 (GAUZE/BANDAGES/DRESSINGS) ×2 IMPLANT
SUT MNCRL AB 4-0 PS2 18 (SUTURE) ×2 IMPLANT
SUT VIC AB 0 CT1 18XCR BRD 8 (SUTURE) IMPLANT
SUT VIC AB 0 CT1 8-18 (SUTURE)
SUT VIC AB 1 CT1 18XCR BRD 8 (SUTURE) ×1 IMPLANT
SUT VIC AB 1 CT1 8-18 (SUTURE) ×2
SUT VIC AB 2-0 CT2 18 VCP726D (SUTURE) ×2 IMPLANT
SYR BULB IRRIGATION 50ML (SYRINGE) ×4 IMPLANT
SYR CONTROL 10ML LL (SYRINGE) ×2 IMPLANT
SYS SPNL FX3ANG 40X7XFSN (Rod) ×1 IMPLANT
SYSTEM SPNL FX3ANG 40X7XFSN (Rod) IMPLANT
TAPE CLOTH SURG 4X10 WHT LF (GAUZE/BANDAGES/DRESSINGS) ×1 IMPLANT
TOWEL OR 17X24 6PK STRL BLUE (TOWEL DISPOSABLE) ×2 IMPLANT
TOWEL OR 17X26 10 PK STRL BLUE (TOWEL DISPOSABLE) ×4 IMPLANT
TUBE CONNECTING 12X1/4 (SUCTIONS) ×2 IMPLANT
WATER STERILE IRR 1000ML POUR (IV SOLUTION) ×2 IMPLANT
YANKAUER SUCT BULB TIP NO VENT (SUCTIONS) ×2 IMPLANT

## 2018-07-14 NOTE — Anesthesia Procedure Notes (Signed)
Procedure Name: Intubation Date/Time: 07/14/2018 10:20 AM Performed by: Moshe Salisbury, CRNA Pre-anesthesia Checklist: Patient identified, Emergency Drugs available, Suction available and Patient being monitored Patient Re-evaluated:Patient Re-evaluated prior to induction Oxygen Delivery Method: Circle System Utilized Preoxygenation: Pre-oxygenation with 100% oxygen Induction Type: IV induction Ventilation: Mask ventilation without difficulty Laryngoscope Size: Mac and 3 Grade View: Grade I Tube type: Oral Tube size: 7.5 mm Number of attempts: 1 Airway Equipment and Method: Stylet Placement Confirmation: ETT inserted through vocal cords under direct vision,  positive ETCO2 and breath sounds checked- equal and bilateral Secured at: 20 cm Tube secured with: Tape Dental Injury: Teeth and Oropharynx as per pre-operative assessment

## 2018-07-14 NOTE — Op Note (Signed)
NAME:  Joann Elliott        MEDICAL RECORD NO.:  [id number]   PHYSICIAN:  Estill Bamberg, MD      DATE OF BIRTH:  24-Dec-1985   DATE OF PROCEDURE:  07/14/2018                               OPERATIVE REPORT     PREOPERATIVE DIAGNOSES: 1.  Right sacroiliac joint dysfunction   POSTOPERATIVE DIAGNOSES: 1.  Right sacroiliac joint dysfunction   PROCEDURES: Minimally invasive right sacroiliac joint fusion   SURGEON:  Estill Bamberg, MD.   ASSISTANTJason Coop, PA-C.   ANESTHESIA:  General endotracheal anesthesia.   COMPLICATIONS:  None.   DISPOSITION:  Stable.   ESTIMATED BLOOD LOSS:  Minimal.   INDICATIONS FOR SURGERY:  Briefly, [name] is a pleasant 33 year old female, who did present to me with ongoing severe pain at the right side of her low back, status post a work injury.  Her work-up was diagnostic for right sacroiliac joint dysfunction.  She did fail appropriate conservative treatment measures, and as such, we did discuss proceeding with the surgery noted above.  The patient was made fully aware of the risks and recovery period associated with surgery, and she did wish to proceed.   OPERATIVE DETAILS:  On 07/14/2018, the patient was brought to surgery and general endotracheal anesthesia was administered.  The patient was placed prone on a flat Jackson bed, withdrawal was placed beneath the patient's chest and hips.  The region of the right buttock was prepped and draped in the usual sterile fashion.  A timeout procedure was performed.  Fluoroscopy was brought into the field.  I was able to ensure adequate lateral, inlet, and outlet radiographs.  At this point, a 3 cm incision was made in line with the posterior border of the sacrum on the right.  3 guidewires were advanced across the sacroiliac joint on the left side.  Fluoroscopy was liberally used while advancing the guidewires in order to ensure a safe trajectory of the guidewires..  I then drilled and broached over the guidewires.   At this point, 7 mm implants were advanced across the sacroiliac joints from superiorly to inferiorly, the length of the implants were 45 mm, 40 mm, and 35 mm.  I was very pleased with the resting position of the implants on lateral, inlet, and outlet fluoroscopy.  The guidewires were then removed.  I was very pleased with the final lateral, inlet, and outlet radiographs.  At this point, the wound was copiously irrigated and closed in layers, using #1 Vicryl, followed by 2-0 Vicryl, followed by 4-0 Monocryl.  All instrument counts were correct at the termination of the procedure.     Of note, Jason Coop was my assistant throughout surgery, and did aid in retraction, suctioning, and closure from start to finish.     Estill Bamberg, MD

## 2018-07-14 NOTE — Anesthesia Postprocedure Evaluation (Signed)
Anesthesia Post Note  Patient: Joann Elliott  Procedure(s) Performed: RIGHT SACROILIAC JOINT FUSION (Right )     Patient location during evaluation: PACU Anesthesia Type: General Level of consciousness: awake and alert Pain management: pain level controlled Vital Signs Assessment: post-procedure vital signs reviewed and stable Respiratory status: spontaneous breathing, nonlabored ventilation and respiratory function stable Cardiovascular status: blood pressure returned to baseline and stable Postop Assessment: no apparent nausea or vomiting Anesthetic complications: no    Last Vitals:  Vitals:   07/14/18 1400 07/14/18 1406  BP:  105/60  Pulse: 77 66  Resp: 13 14  Temp:    SpO2: 98% 100%    Last Pain:  Vitals:   07/14/18 1345  PainSc: Asleep                 Kaylyn Layer

## 2018-07-14 NOTE — Transfer of Care (Signed)
Immediate Anesthesia Transfer of Care Note  Patient: Joann Elliott  Procedure(s) Performed: RIGHT SACROILIAC JOINT FUSION (Right )  Patient Location: PACU  Anesthesia Type:General  Level of Consciousness: drowsy and patient cooperative  Airway & Oxygen Therapy: Patient Spontanous Breathing and Patient connected to nasal cannula oxygen  Post-op Assessment: Report given to RN, Post -op Vital signs reviewed and stable and Patient moving all extremities  Post vital signs: Reviewed and stable  Last Vitals:  Vitals Value Taken Time  BP 116/57 07/14/2018 12:08 PM  Temp    Pulse 78 07/14/2018 12:09 PM  Resp 21 07/14/2018 12:09 PM  SpO2 100 % 07/14/2018 12:09 PM  Vitals shown include unvalidated device data.  Last Pain:  Vitals:   07/14/18 0820  PainSc: 0-No pain         Complications: No apparent anesthesia complications

## 2018-07-14 NOTE — H&P (Signed)
PREOPERATIVE H&P  Chief Complaint: Low back pain  HPI: Joann Elliott is a 33 y.o. female who presents with ongoing pain in the right side of her low back  Patient's history and exam is consistent with SI joint dysfunction.  Patient has failed multiple forms of conservative care and continues to have pain (see office notes for additional details regarding the patient's full course of treatment)  Past Medical History:  Diagnosis Date  . Headache   . Neuromuscular disorder [facility](HCC)    Past Surgical History:  Procedure Laterality Date  . CESAREAN SECTION  12/08/2014  . TUBAL LIGATION     Social History   Socioeconomic History  . Marital status: Single    Spouse name: Not on file  . Number of children: Not on file  . Years of education: Not on file  . Highest education level: Not on file  Occupational History  . Not on file  Social Needs  . Financial resource strain: Not on file  . Food insecurity:    Worry: Not on file    Inability: Not on file  . Transportation needs:    Medical: Not on file    Non-medical: Not on file  Tobacco Use  . Smoking status: Never Smoker  . Smokeless tobacco: Never Used  Substance and Sexual Activity  . Alcohol use: No  . Drug use: No  . Sexual activity: Yes    Birth control/protection: Injection  Lifestyle  . Physical activity:    Days per week: Not on file    Minutes per session: Not on file  . Stress: Not on file  Relationships  . Social connections:    Talks on phone: Not on file    Gets together: Not on file    Attends religious service: Not on file    Active member of club or organization: Not on file    Attends meetings of clubs or organizations: Not on file    Relationship status: Not on file  Other Topics Concern  . Not on file  Social History Narrative  . Not on file   No family history on file. No Known Allergies Prior to Admission medications   Medication Sig Start Date End Date Taking? Authorizing Provider   aspirin-acetaminophen-caffeine (EXCEDRIN MIGRAINE) [phone] MG tablet Take 2 tablets by mouth every 6 (six) hours as needed for headache or migraine.   Yes [provider]  sertraline (ZOLOFT) 50 MG tablet Take 50 mg by mouth at bedtime.   Yes [provider]  tiZANidine (ZANAFLEX) 4 MG tablet Take 4 mg by mouth at bedtime.   Yes [provider]  HYDROcodone-acetaminophen (NORCO/VICODIN) 5-325 MG tablet Take 1 tablet by mouth every 4 (four) hours as needed. Patient not taking: Reported on 07/12/2018 10/29/15   Ivery QualeBryant, [place], PA-C     All other systems have been reviewed and were otherwise negative with the exception of those mentioned in the HPI and as above.  Physical Exam: There were no vitals filed for this visit.  There is no height or weight on file to calculate BMI.  General: Alert, no acute distress Cardiovascular: No pedal edema Respiratory: No cyanosis, no use of accessory musculature Skin: No lesions in the area of chief complaint Neurologic: Sensation intact distally Psychiatric: Patient is competent for consent with normal mood and affect Lymphatic: No axillary or cervical lymphadenopathy  MUSCULOSKELETAL: + TTP right low back  Assessment/Plan: RIGHT SACROILIAC JOINT DYSFUNCTION Plan for Procedure(s): RIGHT SACROILIAC JOINT  FUSION   Jackelyn Hoehn, MD 07/14/2018 7:31 AM

## 2018-07-15 ENCOUNTER — Encounter (HOSPITAL_COMMUNITY): Payer: Self-pay | Admitting: Orthopedic Surgery

## 2018-08-31 ENCOUNTER — Ambulatory Visit: Payer: Medicaid Other | Admitting: Physical Therapy

## 2018-09-06 ENCOUNTER — Encounter: Payer: Self-pay | Admitting: Physical Therapy

## 2018-09-06 ENCOUNTER — Ambulatory Visit: Payer: Medicaid Other | Attending: Orthopedic Surgery | Admitting: Physical Therapy

## 2018-09-06 ENCOUNTER — Other Ambulatory Visit: Payer: Self-pay

## 2018-09-06 DIAGNOSIS — R262 Difficulty in walking, not elsewhere classified: Secondary | ICD-10-CM

## 2018-09-06 DIAGNOSIS — M545 Low back pain, unspecified: Secondary | ICD-10-CM

## 2018-09-06 NOTE — Therapy (Addendum)
[facility] [place], [place], [address] Phone: [phone]   Fax:  [phone]  Physical Therapy Evaluation PHYSICAL THERAPY DISCHARGE SUMMARY  Visits from Start of Care: 1  Current functional level related to goals / functional outcomes: See below   Remaining deficits: See goals   Education / Equipment: HEP Plan: Patient agrees to discharge.  Patient goals were not met. Patient is being discharged due to not returning since the last visit.  ?????  Gabriela Eves, PT, DPT  11/28/18   Patient Details  Name: Joann Elliott MRN: [id number] Date of Birth: 11-26-1985 Referring Provider (PT): Phylliss Bob, MD   Encounter Date: 09/06/2018  PT End of Session - 09/06/18 1214    Visit Number  1    Number of Visits  12    Date for PT Re-Evaluation  10/18/18    Authorization Type  Medicaid;    PT Start Time  1116    PT Stop Time  1200    PT Time Calculation (min)  44 min    Activity Tolerance  Patient limited by pain    Behavior During Therapy  [facility] for tasks assessed/performed       Past Medical History:  Diagnosis Date  . Headache   . Neuromuscular disorder [facility])     Past Surgical History:  Procedure Laterality Date  . CESAREAN SECTION  12/08/2014  . SACROILIAC JOINT FUSION Right 07/14/2018   Procedure: RIGHT SACROILIAC JOINT FUSION;  Surgeon: Phylliss Bob, MD;  Location: [facility];  Service: Orthopedics;  Laterality: Right;  . TUBAL LIGATION      There were no vitals filed for this visit.   Subjective Assessment - 09/06/18 2142    Subjective  COVID-19 screening performed upon arrival.Patient arrives to physical therapy with reports of right glute and right SIJ pain and difficulty walking due to a right SIJ fusion on 07/14/2018. Patient reports difficulties with ADLs especially lower body dressing and caring for her children. Patient reports pain with prolonged standing and walking and coming to standing upright  from a bent over position. Patient reports pain at worst as 7/10 and pain at best 4/10. Patient reports occasional numbness to 4th toe. Patient reported compliance with no lifting greater than 8 lbs protocol. Patient's goals are to decrease pain, improve movement, improve ability to perform work home and activities, and to return to work.    Pertinent History  R SIJ fusion 07/14/2018    Limitations  Standing;Walking;House hold activities    How long can you sit comfortably?  20-30 mins    How long can you stand comfortably?  15-20 mins    How long can you walk comfortably?  10 mins    Diagnostic tests  x-ray, CT scan    Patient Stated Goals  return to work, decrease pain    Currently in Pain?  Yes    Pain Score  5     Pain Location  Sacrum    Pain Orientation  Right    Pain Descriptors / Indicators  Sore;Sharp;Aching    Pain Type  Surgical pain    Pain Onset  More than a month ago    Pain Frequency  Constant    Aggravating Factors   a lot of walking or sitting too long    Pain Relieving Factors  medication    Effect of Pain on Daily Activities  pain with ADLs, LE dressing, and caregiving activities.  [facility] PT Assessment - 09/06/18 0001      Assessment   Medical Diagnosis  S/P Right SIJ fusion    Referring Provider (PT)  Phylliss Bob, MD    Onset Date/Surgical Date  07/14/18    Next MD Visit  Aug 28th or 29th    Prior Therapy  No      Precautions   Precautions  Other (comment)    Precaution Comments  no lifting >8lbs      Restrictions   Weight Bearing Restrictions  No      Balance Screen   Has the patient fallen in the past 6 months  No    Has the patient had a decrease in activity level because of a fear of falling?   No    Is the patient reluctant to leave their home because of a fear of falling?   No      Home Environment   Living Environment  Private residence    Living Arrangements  Spouse/significant other;Children      Prior Function   Level of  Independence  Independent with basic ADLs;Needs assistance with ADLs    Vocation  Other (comment)   Medical leave   Vocation Requirements  CNA lifting and carrying      Observation/Other Assessments   Observations  increased left weight shift in stting      Posture/Postural Control   Posture/Postural Control  Postural limitations    Postural Limitations  Rounded Shoulders;Forward head;Decreased lumbar lordosis      ROM / Strength   AROM / PROM / Strength  AROM;Strength      AROM   Overall AROM Comments  measured in left sidelying    AROM Assessment Site  Hip    Right/Left Hip  Right    Right Hip Flexion  70    Right Hip ABduction  10      Strength   Overall Strength Comments  Grossly assessed 3/5 (+) pain      Palpation   Palpation comment  Increased tenderness to palpation to right SIJ, incisional area. minimal to upper gluteals and lumbar paraspinals      Bed Mobility   Bed Mobility  Sit to Supine    Sit to Supine  Supervision/Verbal cueing   long sit to supine transition     Transfers   Comments  slow transitional movements plus pain, unable to lay supine secondary to increased pain in right SIJ                Objective measurements completed on examination: See above findings.              PT Education - 09/06/18 1218    Education Details  Draw ins, heel raises, toe raises, hip adduction isometrics, hip abduction isometrics, bed mobility    Person(s) Educated  Patient    Methods  Explanation;Demonstration;Handout    Comprehension  Verbalized understanding;Returned demonstration       PT Short Term Goals - 09/06/18 2204      PT SHORT TERM GOAL #1   Title  Patient will be independent with HEP    Baseline  no knowledge of HEP    Time  3    Status  New      PT SHORT TERM GOAL #2   Title  Patient will demonstrate independence with log rolling bed mobility to protect spine during transitions.    Baseline  supine to long sit transitions.     Time  3    Period  Weeks    Status  New        PT Long Term Goals - 09/06/18 2206      PT LONG TERM GOAL #1   Title  Patient will be independent with advanced HEP    Baseline  no knowledge of HEP    Time  6    Period  Weeks    Status  New      PT LONG TERM GOAL #2   Title  Patient will demonstrate 110+ degrees of right hip flexion AROM to improve ability to perform functional tasks.    Baseline  70 degrees of right hip flexion AROM    Time  6    Period  Weeks    Status  New      PT LONG TERM GOAL #3   Title  Patient will report ability to perform lower body dressing with right SIJ pain less than or equal to 4/10.    Baseline  7/10 sharp, tight pain in right SIJ    Time  6    Period  Weeks    Status  New      PT LONG TERM GOAL #4   Title  Patient will report ability to peform ADLs and caregiving activities with right SIJ pain less than or equal to 4/10.    Baseline  7/10 sharp, tight pain in right SIJ    Time  6    Period  Weeks    Status  New      PT LONG TERM GOAL #5   Title  Patient will demonstrate 4/5 or greater right hip MMT in all planes to improve stability during functional tasks.    Baseline  Grossly assessed 3/5 (+) pain    Time  6    Period  Weeks    Status  New             Plan - 09/06/18 2202    Clinical Impression Statement  Patient is a 33 year old female who presents to physical therapy with right low back and SIJ pain, difficulties with transitional movements, and decreased right hip AROM. Patient unable to assess ROM in supine secondary to increased pain therefore right hip AROM measured in left sidelying. Patient tender to palpation to scar, right SIJ and right gluteal. Patient demonstrated a long sit to supine transition. Patient and PT reviewed HEP and proper bed mobility mechanics to which patient reported understanding and compliance. Patient would benefit from skilled physical therapy to address deficits and address patient's goals.     Examination-Activity Limitations  Bend;Caring for Others;Sleep;Stand;Dressing    Examination-Participation Restrictions  Cleaning;Laundry    Stability/Clinical Decision Making  Stable/Uncomplicated    Clinical Decision Making  Low    Rehab Potential  Good    PT Frequency  2x / week    PT Duration  6 weeks    PT Treatment/Interventions  ADLs/Self Care Home Management;Cryotherapy;Electrical Stimulation;Moist Heat;Iontophoresis 19m/ml Dexamethasone;Gait training;Stair training;Functional mobility training;Therapeutic activities;Therapeutic exercise;Balance training;Patient/family education;Neuromuscular re-education;Manual techniques;Scar mobilization;Passive range of motion;Taping    PT Next Visit Plan  nustep or bike, gentle seated or sidelying LE strengthening and stretching, PROM to right hip if tolerable, Modalities PRN for pain relief    PT Home Exercise Plan  see patient education section    Consulted and Agree with Plan of Care  Patient       Patient will benefit from skilled therapeutic intervention in order to improve the  following deficits and impairments:  Decreased activity tolerance, Decreased strength, Decreased range of motion, Difficulty walking, Improper body mechanics, Pain  Visit Diagnosis: 1. Acute low back pain, unspecified back pain laterality, unspecified whether sciatica present   2. Difficulty in walking, not elsewhere classified        Problem List There are no active problems to display for this patient.  Gabriela Eves, PT, DPT 09/06/2018, 10:15 PM  [facility] [address] [place], [place], [address] Phone: [phone]   Fax:  [phone]  Name: DALANIE KISNER MRN: [id number] Date of Birth: [birth year]

## 2018-09-20 ENCOUNTER — Ambulatory Visit: Payer: Medicaid Other | Admitting: *Deleted

## 2018-09-22 ENCOUNTER — Ambulatory Visit: Payer: Medicaid Other | Attending: Orthopedic Surgery | Admitting: *Deleted

## 2018-09-23 ENCOUNTER — Ambulatory Visit: Payer: Medicaid Other | Admitting: Physical Therapy

## 2018-10-11 ENCOUNTER — Ambulatory Visit: Payer: Medicaid Other | Attending: Orthopedic Surgery | Admitting: Physical Therapy

## 2018-10-13 ENCOUNTER — Ambulatory Visit: Payer: Medicaid Other | Admitting: Physical Therapy

## 2018-11-28 ENCOUNTER — Encounter: Payer: Self-pay | Admitting: Physical Therapy

## 2018-11-28 ENCOUNTER — Ambulatory Visit: Payer: Medicaid Other | Attending: Orthopedic Surgery | Admitting: Physical Therapy

## 2018-11-28 ENCOUNTER — Other Ambulatory Visit: Payer: Self-pay

## 2018-11-28 DIAGNOSIS — M545 Low back pain, unspecified: Secondary | ICD-10-CM

## 2018-11-28 NOTE — Therapy (Signed)
[facility] [place], [place], [address] Phone: [phone]   Fax:  [phone]  Physical Therapy Evaluation  Patient Details  Name: Joann Elliott MRN: [id number] Date of Birth: 17-Aug-1985 Referring Provider (PT): Phylliss Bob MD   Encounter Date: 11/28/2018  PT End of Session - 11/28/18 1439    Visit Number  1    Number of Visits  12    Date for PT Re-Evaluation  01/16/19    Authorization Type  Medicaid;    PT Start Time  0150    PT Stop Time  0221    PT Time Calculation (min)  31 min    Activity Tolerance  Patient tolerated treatment well    Behavior During Therapy  [facility] for tasks assessed/performed       Past Medical History:  Diagnosis Date  . Headache   . Neuromuscular disorder [facility])     Past Surgical History:  Procedure Laterality Date  . CESAREAN SECTION  12/08/2014  . SACROILIAC JOINT FUSION Right 07/14/2018   Procedure: RIGHT SACROILIAC JOINT FUSION;  Surgeon: Phylliss Bob, MD;  Location: [place];  Service: Orthopedics;  Laterality: Right;  . TUBAL LIGATION      There were no vitals filed for this visit.   Subjective Assessment - 11/28/18 1435    Subjective  COVID-19 screen performed prior to patient entering clinic.  The patient underwent a right SIJ fusion surgery on 07/14/18.  She states she has improved significantly since her surgery.  Her current pain-level is a 4/10 but can rise to 7/10 with prolonged sitting, standing and walking.  She reports occasional pain into her right posterior thigh and some remaining right sided low back pain.    Pertinent History  R SIJ fusion 07/14/2018, C-section.    Limitations  Standing;Walking;House hold activities    How long can you sit comfortably?  30 minutes.    How long can you stand comfortably?  30 minutes    How long can you walk comfortably?  Up to an hour.    Diagnostic tests  x-ray, CT scan    Patient Stated Goals  return to work, decrease pain    Currently in Pain?  Yes    Pain Score  4     Pain Location  Back    Pain Orientation  Right    Pain Descriptors / Indicators  Aching    Pain Type  Surgical pain    Pain Onset  More than a month ago    Aggravating Factors   Prolonged walking, standing and sitting.         [facility] PT Assessment - 11/28/18 0001      Assessment   Medical Diagnosis  S/p RT SIJ fusion.    Referring Provider (PT)  Phylliss Bob MD    Onset Date/Surgical Date  --   07/14/18.     Precautions   Precaution Comments  --   No lifting >10 pounds.     Restrictions   Weight Bearing Restrictions  No      Balance Screen   Has the patient fallen in the past 6 months  No    Has the patient had a decrease in activity level because of a fear of falling?   No    Is the patient reluctant to leave their home because of a fear of falling?   No      Prior Function   Level of Independence  Independent  Posture/Postural Control   Posture/Postural Control  No significant limitations    Postural Limitations  Rounded Shoulders;Forward head      AROM   Overall AROM Comments  Lumbar extension to 15 degrees and flexion to 50% performed within a pain-free (no pain increase ROM).      Strength   Overall Strength Comments  Right ankle= 5/5, right knee extension= 5/5, knee flexion= 4 to 4+/5.      Palpation   Palpation comment  Tender over right SIJ and incisional area.      Ambulation/Gait   Gait Comments  Decreased cadence but generally normal.                Objective measurements completed on examination: See above findings.                PT Short Term Goals - 09/06/18 2204      PT SHORT TERM GOAL #1   Title  Patient will be independent with HEP    Baseline  no knowledge of HEP    Time  3    Status  New      PT SHORT TERM GOAL #2   Title  Patient will demonstrate independence with log rolling bed mobility to protect spine during transitions.    Baseline  supine to long sit  transitions.    Time  3    Period  Weeks    Status  New        PT Long Term Goals - 09/06/18 2206      PT LONG TERM GOAL #1   Title  Patient will be independent with advanced HEP    Baseline  no knowledge of HEP    Time  6    Period  Weeks    Status  New      PT LONG TERM GOAL #2   Title  Patient will demonstrate 110+ degrees of right hip flexion AROM to improve ability to perform functional tasks.    Baseline  70 degrees of right hip flexion AROM    Time  6    Period  Weeks    Status  New      PT LONG TERM GOAL #3   Title  Patient will report ability to perform lower body dressing with right SIJ pain less than or equal to 4/10.    Baseline  7/10 sharp, tight pain in right SIJ    Time  6    Period  Weeks    Status  New      PT LONG TERM GOAL #4   Title  Patient will report ability to peform ADLs and caregiving activities with right SIJ pain less than or equal to 4/10.    Baseline  7/10 sharp, tight pain in right SIJ    Time  6    Period  Weeks    Status  New      PT LONG TERM GOAL #5   Title  Patient will demonstrate 4/5 or greater right hip MMT in all planes to improve stability during functional tasks.    Baseline  Grossly assessed 3/5 (+) pain    Time  6    Period  Weeks    Status  New             Plan - 11/28/18 1444    Clinical Impression Statement  The patient presents to OPPT s/p right SIJ fusion performed on 07/14/18.  She is pleased with her  progress thus far.  She has some remaining pain in her right SIJ and incisional area and occasional pain into her right posterior thigh.  She has increased pain with prolonged standing, sitting and walking.  Patient expected to do well with skilled physical therapay intervention to address deficits and pain.    Personal Factors and Comorbidities  Comorbidity 1    Comorbidities  COVID-19 screen performed prior to patient entering clinic.    Examination-Activity Limitations  Stand;Sit;Locomotion Level;Other     Stability/Clinical Decision Making  Stable/Uncomplicated    Clinical Decision Making  Low    Rehab Potential  Good    PT Frequency  2x / week    PT Duration  6 weeks    PT Treatment/Interventions  ADLs/Self Care Home Management;Cryotherapy;Electrical Stimulation;Moist Heat;Iontophoresis 4mg /ml Dexamethasone;Gait training;Stair training;Functional mobility training;Therapeutic activities;Therapeutic exercise;Balance training;Patient/family education;Neuromuscular re-education;Manual techniques;Scar mobilization;Passive range of motion;Taping    PT Next Visit Plan  Nustep/bike, hip bridges, SLR's, hip abduction, core exercise progression.  Modalities and STW/M as needed.    Consulted and Agree with Plan of Care  Patient       Patient will benefit from skilled therapeutic intervention in order to improve the following deficits and impairments:  Pain, Decreased activity tolerance, Decreased strength, Decreased range of motion  Visit Diagnosis: Acute low back pain, unspecified back pain laterality, unspecified whether sciatica present - Plan: PT plan of care cert/re-cert     Problem List There are no active problems to display for this patient.   Shaniqwa Horsman, [place] MPT 11/28/2018, 2:52 PM  [facility] [address] [place], [place], [address] Phone: [phone]   Fax:  [phone]  Name: Hilbert Corriganauvii S Coca MRN: [id number] Date of Birth: 07/23/1985

## 2018-12-14 ENCOUNTER — Ambulatory Visit: Payer: Medicaid Other | Attending: Orthopedic Surgery | Admitting: Physical Therapy

## 2018-12-16 ENCOUNTER — Ambulatory Visit: Payer: Medicaid Other | Admitting: Physical Therapy

## 2019-02-08 ENCOUNTER — Ambulatory Visit: Payer: Medicaid Other | Attending: Internal Medicine

## 2021-05-08 IMAGING — RF DG C-ARM 61-120 MIN
1 series · 3 of 3 positions shown · non-contrast
Comparison: None.

CLINICAL DATA: Right SI joint fusion

EXAM:
DG C-ARM 61-120 MIN

[Series 1: run · 3 of 3 slices shown]
[im 1/3]
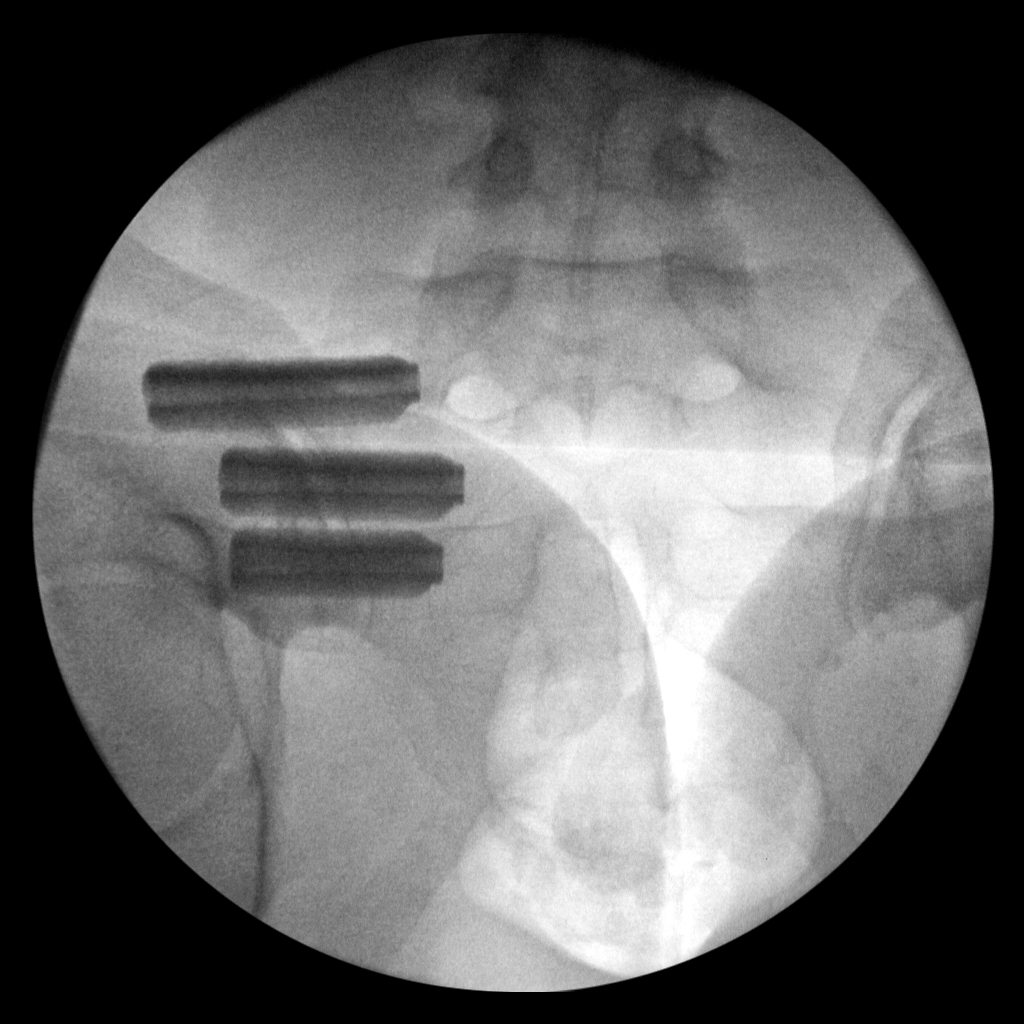
[im 2/3]
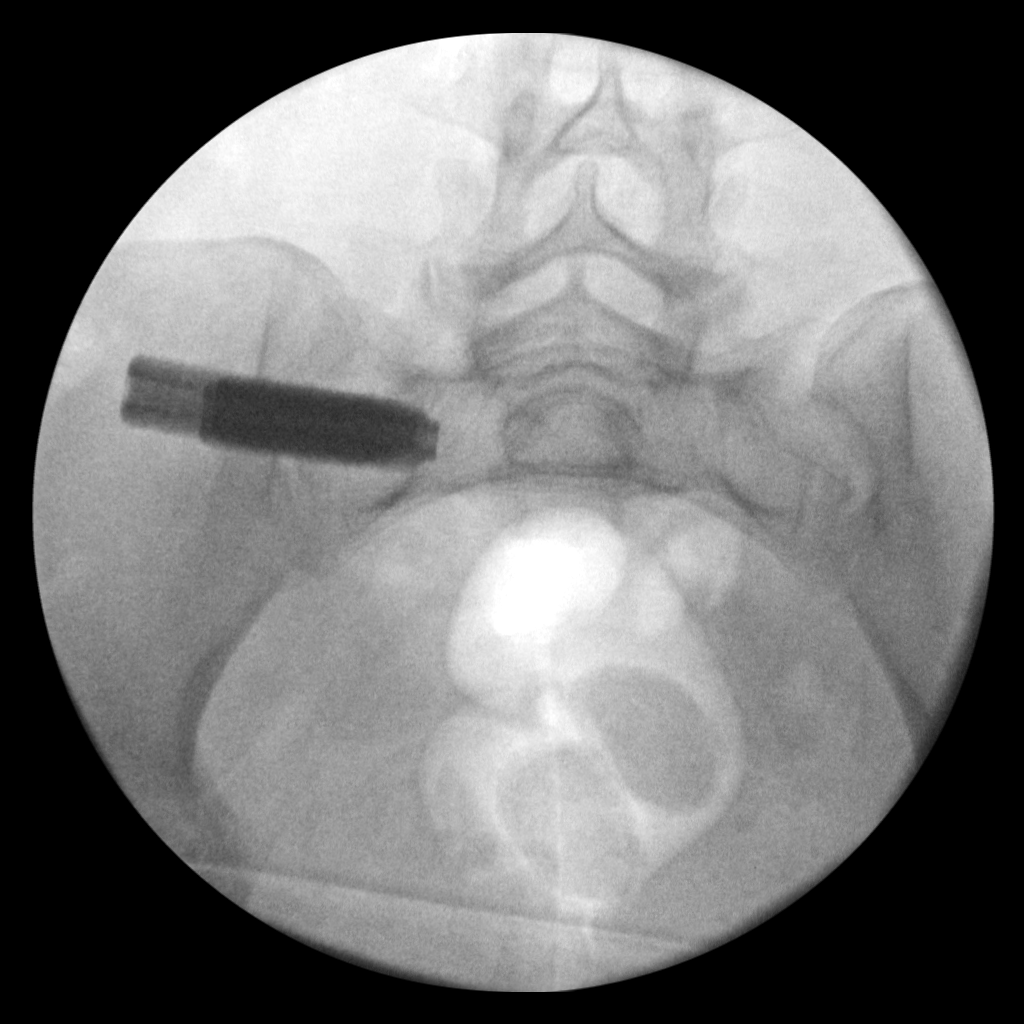
[im 3/3]
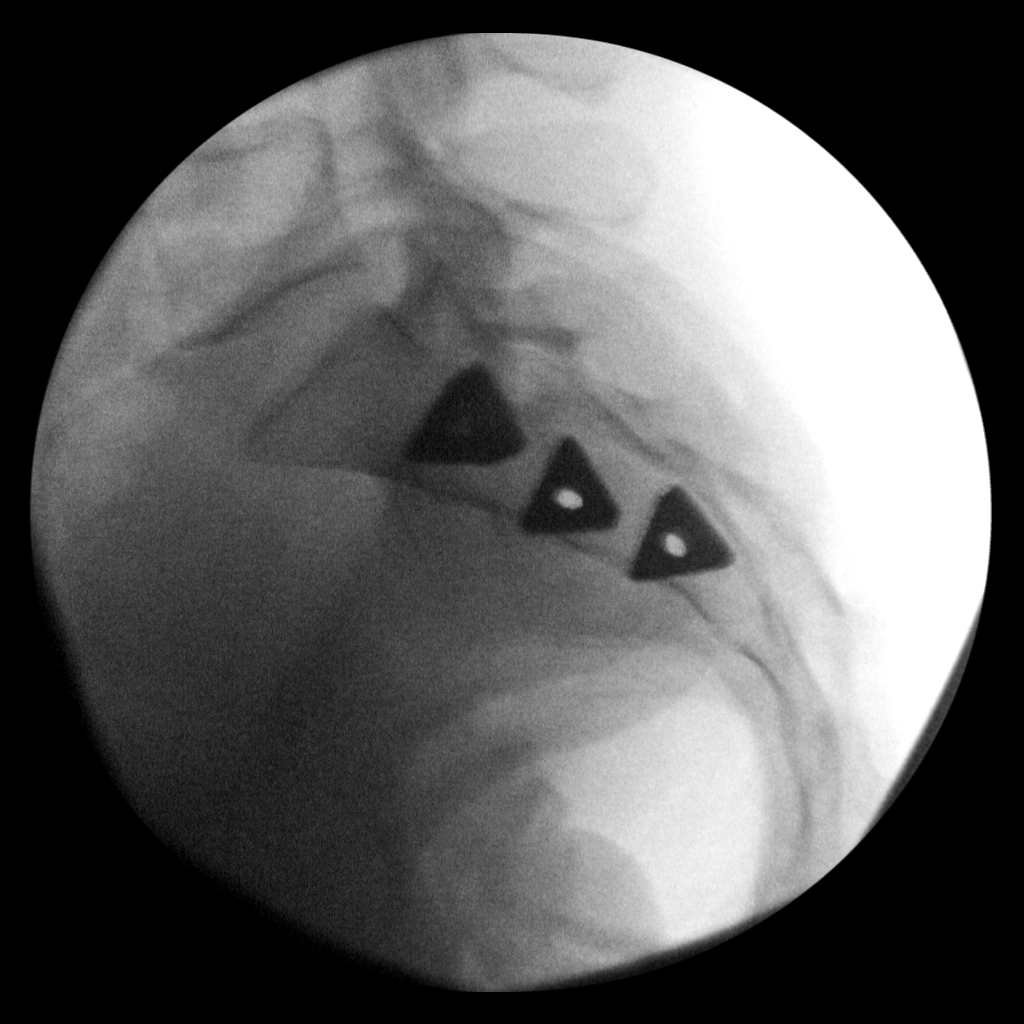

[3 of 3 positions shown; findings below may reference images not displayed]

FLUOROSCOPY TIME:  Fluoroscopy Time:  3 minutes 25 seconds

Radiation Exposure Index (if provided by the fluoroscopic device):
Not available

Number of Acquired Spot Images: 3
FINDINGS: Three fixators are noted traversing the right sacroiliac joint. No
other focal abnormality is noted.
IMPRESSION: Right sacroiliac joint fixation.
